# Patient Record
Sex: Female | Born: 1995 | Race: White | Hispanic: No | Marital: Single | Smoking: Never smoker
Health system: Southern US, Community
[De-identification: ages and names within clinical notes are randomized; demographics above are authoritative.]

## PROBLEM LIST (undated history)

## (undated) DIAGNOSIS — A749 Chlamydial infection, unspecified: Secondary | ICD-10-CM

## (undated) DIAGNOSIS — N200 Calculus of kidney: Secondary | ICD-10-CM

## (undated) DIAGNOSIS — Z789 Other specified health status: Secondary | ICD-10-CM

## (undated) HISTORY — DX: Other specified health status: Z78.9

## (undated) HISTORY — DX: Chlamydial infection, unspecified: A74.9

## (undated) HISTORY — PX: NO PAST SURGERIES: SHX2092

---
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
## 2014-01-20 LAB — OB RESULTS CONSOLE ABO/RH: RH Type: POSITIVE

## 2014-01-20 LAB — OB RESULTS CONSOLE HEPATITIS B SURFACE ANTIGEN: Hepatitis B Surface Ag: NEGATIVE

## 2014-01-20 LAB — OB RESULTS CONSOLE GC/CHLAMYDIA
CHLAMYDIA, DNA PROBE: NEGATIVE
GC PROBE AMP, GENITAL: NEGATIVE

## 2014-01-20 LAB — OB RESULTS CONSOLE RPR: RPR: NONREACTIVE

## 2014-01-20 LAB — OB RESULTS CONSOLE ANTIBODY SCREEN: ANTIBODY SCREEN: NEGATIVE

## 2014-01-20 LAB — OB RESULTS CONSOLE HIV ANTIBODY (ROUTINE TESTING): HIV: NONREACTIVE

## 2014-01-20 LAB — OB RESULTS CONSOLE RUBELLA ANTIBODY, IGM: RUBELLA: NON-IMMUNE/NOT IMMUNE

## 2014-03-24 NOTE — L&D Delivery Note (Signed)
Operative Delivery Note Pt pushed for about 2 hours and began to make less progress.  FHR had persistent  Variable decelerations of increasing intensity and the patient was becoming tired.  She and partner were counseled on the risks and benfits of a vacuum assisted delivery.   The softcup bell vacuum was placed on the vertex and inflated to the green zone.  Over 2 pulls and one pop off the vertex was brought to a +3 station.  The pt then pushed a few more times but could not bring the head to crowning.  The vacuum was changed to a kiwi because the bell was pushing against the suprapubic bone and deflating. The Kiwi was placed and the vertex delivered to crowning in one pull and no popofffs.   After delivery of the head a moderate shoulder dystocia was encountered and required Macroberts and posterior arm lift to effect delivery after 90 seconds.    At 10:03 PM a viable female was delivered via Vaginal, Spontaneous Delivery.  Presentation: vertex; Position: Left,, Occiput,, Anterior; Station: +2.  The baby was floppy at delivery and NICU called to assess.  She responded well to stimulation and O2.    Verbal consent: obtained from patient.  Risks and benefits discussed in detail.  Risks include, but are not limited to the risks of anesthesia, bleeding, infection, damage to maternal tissues, fetal cephalhematoma.  There is also the risk of inability to effect vaginal delivery of the head, or shoulder dystocia that cannot be resolved by established maneuvers, leading to the need for emergency cesarean section.  APGAR:  2 , 8; weight  7#1oz.   Placenta status: Intact, Spontaneous.   Cord: 3 vessels with the following complications: None.  Cord pH: 7.27  Anesthesia: Epidural  Instruments: vacuum Episiotomy: None Lacerations: 1st degree Suture Repair: 3.0 vicryl rapide Est. Blood Loss (mL): 305cc Pt had moderate atony that did not respond to pitocin initially, so given methergine x 1.  Mom to postpartum.   Baby to Couplet care / Skin to Skin.  Oliver PilaRICHARDSON,Fredrica Capano W 08/14/2014, 10:49 PM

## 2014-07-12 LAB — OB RESULTS CONSOLE GBS: STREP GROUP B AG: NEGATIVE

## 2014-08-09 ENCOUNTER — Telehealth (HOSPITAL_COMMUNITY): Payer: Self-pay | Admitting: *Deleted

## 2014-08-09 ENCOUNTER — Encounter (HOSPITAL_COMMUNITY): Payer: Self-pay | Admitting: *Deleted

## 2014-08-09 NOTE — Telephone Encounter (Signed)
Preadmission screen  

## 2014-08-12 NOTE — H&P (Signed)
Alexandra Spencer is a 19 y.o. female G1P0 at 5940 2/7 weeks (EDD 08/11/14 by 8 week US, inconsistent with LMP) presenting for IOL post due date.  Prenatal care relatively uncomplicated.  Anatomy US showed isolated EIF, but all else WNL and first trimester screen and AFP WNL.  Rubella status non-immune.  She measured S<D but an US performed 06/28/14 demonstrated the baby at the 48%ile and normal AFI.    Maternal Medical History:  Contractions: Frequency: irregular.   Perceived severity is mild.    Fetal activity: Perceived fetal activity is normal.    Prenatal Complications - Diabetes: none.    OB History    Gravida Para Term Preterm AB TAB SAB Ectopic Multiple Living   1              Past Medical History  Diagnosis Date  . Medical history non-contributory    Past Surgical History  Procedure Laterality Date  . No past surgeries     Family History: family history is not on file. Social History:  reports that she has never smoked. She does not have any smokeless tobacco history on file. She reports that she does not drink alcohol or use illicit drugs.   Prenatal Transfer Tool  Maternal Diabetes: No Genetic Screening: Normal Maternal Ultrasounds/Referrals: Normal Fetal Ultrasounds or other Referrals:  None Maternal Substance Abuse:  No Significant Maternal Medications:  None Significant Maternal Lab Results:  None Other Comments:  None  ROS    Last menstrual period 11/05/2013. Exam Physical Exam  Constitutional: She appears well-developed and well-nourished.  Cardiovascular: Normal rate and regular rhythm.   Respiratory: Effort normal.  GI: Soft.  Genitourinary: Vagina normal.  Neurological: She is alert.  Psychiatric: She has a normal mood and affect.    Prenatal labs: ABO, Rh: A/Positive/-- (10/30 0000) Antibody: Negative (10/30 0000) Rubella: Nonimmune (10/30 0000) RPR: Nonreactive (10/30 0000)  HBsAg: Negative (10/30 0000)  HIV: Non-reactive (10/30 0000)  GBS:  Negative (04/20 0000)  First trimester screen and AFP negative One hour GTT 109  Assessment/Plan: Pt for ripening and IOL given post due date.  GBS negative.  Plan cytotec and AROM/Pitocin  Alexandra Spencer,Alexandra Spencer 08/12/2014, 3:11 PM

## 2014-08-13 ENCOUNTER — Encounter (HOSPITAL_COMMUNITY): Payer: Self-pay

## 2014-08-13 ENCOUNTER — Inpatient Hospital Stay (HOSPITAL_COMMUNITY)
Admission: RE | Admit: 2014-08-13 | Discharge: 2014-08-16 | DRG: 775 | Disposition: A | Discharge status: Home / Self Care | Payer: Medicaid Other | Source: Ambulatory Visit | Attending: Obstetrics and Gynecology | Admitting: Obstetrics and Gynecology

## 2014-08-13 DIAGNOSIS — O480 Post-term pregnancy: Secondary | ICD-10-CM | POA: Diagnosis present

## 2014-08-13 DIAGNOSIS — Z3A40 40 weeks gestation of pregnancy: Secondary | ICD-10-CM | POA: Diagnosis present

## 2014-08-13 DIAGNOSIS — Z3403 Encounter for supervision of normal first pregnancy, third trimester: Secondary | ICD-10-CM | POA: Diagnosis present

## 2014-08-13 LAB — CBC
HEMATOCRIT: 34.4 % — AB (ref 36.0–46.0)
HEMOGLOBIN: 11.5 g/dL — AB (ref 12.0–15.0)
MCH: 27.4 pg (ref 26.0–34.0)
MCHC: 33.4 g/dL (ref 30.0–36.0)
MCV: 81.9 fL (ref 78.0–100.0)
PLATELETS: 294 10*3/uL (ref 150–400)
RBC: 4.2 MIL/uL (ref 3.87–5.11)
RDW: 13.1 % (ref 11.5–15.5)
WBC: 11.5 10*3/uL — AB (ref 4.0–10.5)

## 2014-08-13 LAB — TYPE AND SCREEN
ABO/RH(D): A POS
ANTIBODY SCREEN: NEGATIVE

## 2014-08-13 MED ORDER — OXYTOCIN 40 UNITS IN LACTATED RINGERS INFUSION - SIMPLE MED
1.0000 m[IU]/min | INTRAVENOUS | Status: DC
  Administered 2014-08-14 (×2): 2 m[IU]/min via INTRAVENOUS
  Filled 2014-08-13: qty 1000

## 2014-08-13 MED ORDER — OXYTOCIN BOLUS FROM INFUSION
500.0000 mL | INTRAVENOUS | Status: DC
Start: 2014-08-13 — End: 2014-08-15

## 2014-08-13 MED ORDER — LIDOCAINE HCL (PF) 1 % IJ SOLN
30.0000 mL | INTRAMUSCULAR | Status: DC | PRN
  Filled 2014-08-13: qty 30

## 2014-08-13 MED ORDER — OXYTOCIN 40 UNITS IN LACTATED RINGERS INFUSION - SIMPLE MED: 62.5000 mL/h | INTRAVENOUS | Status: DC

## 2014-08-13 MED ORDER — CITRIC ACID-SODIUM CITRATE 334-500 MG/5ML PO SOLN
30.0000 mL | ORAL | Status: DC | PRN
  Administered 2014-08-13: 30 mL via ORAL
  Filled 2014-08-13: qty 15

## 2014-08-13 MED ORDER — OXYCODONE-ACETAMINOPHEN 5-325 MG PO TABS: 1.0000 | ORAL_TABLET | ORAL | Status: DC | PRN

## 2014-08-13 MED ORDER — TERBUTALINE SULFATE 1 MG/ML IJ SOLN: 0.2500 mg | Freq: Once | INTRAMUSCULAR | Status: AC | PRN

## 2014-08-13 MED ORDER — BUTORPHANOL TARTRATE 1 MG/ML IJ SOLN: 1.0000 mg | INTRAMUSCULAR | Status: DC | PRN

## 2014-08-13 MED ORDER — MISOPROSTOL 25 MCG QUARTER TABLET
25.0000 ug | ORAL_TABLET | ORAL | Status: DC | PRN
  Administered 2014-08-13: 25 ug via VAGINAL
  Administered 2014-08-14: 1000 ug via VAGINAL
  Administered 2014-08-14 (×2): 25 ug via VAGINAL
  Filled 2014-08-13 (×3): qty 0.25
  Filled 2014-08-13: qty 1

## 2014-08-13 MED ORDER — LACTATED RINGERS IV SOLN
500.0000 mL | INTRAVENOUS | Status: DC | PRN
  Administered 2014-08-14: 500 mL via INTRAVENOUS

## 2014-08-13 MED ORDER — LACTATED RINGERS IV SOLN
INTRAVENOUS | Status: DC
  Administered 2014-08-13 – 2014-08-14 (×4): via INTRAVENOUS

## 2014-08-13 MED ORDER — ONDANSETRON HCL 4 MG/2ML IJ SOLN
4.0000 mg | Freq: Four times a day (QID) | INTRAMUSCULAR | Status: DC | PRN
  Administered 2014-08-14 (×2): 4 mg via INTRAVENOUS
  Filled 2014-08-13 (×2): qty 2

## 2014-08-13 MED ORDER — ACETAMINOPHEN 325 MG PO TABS: 650.0000 mg | ORAL_TABLET | ORAL | Status: DC | PRN

## 2014-08-13 MED ORDER — OXYCODONE-ACETAMINOPHEN 5-325 MG PO TABS: 2.0000 | ORAL_TABLET | ORAL | Status: DC | PRN

## 2014-08-13 NOTE — Plan of Care (Signed)
Problem: Consults Goal: Birthing Suites Patient Information Press F2 to bring up selections list Outcome: Completed/Met Date Met:  08/13/14  Pt > [redacted] weeks EGA and Inpatient induction

## 2014-08-14 ENCOUNTER — Encounter (HOSPITAL_COMMUNITY): Payer: Self-pay

## 2014-08-14 ENCOUNTER — Inpatient Hospital Stay (HOSPITAL_COMMUNITY): Payer: Medicaid Other | Admitting: Anesthesiology

## 2014-08-14 LAB — RPR: RPR: NONREACTIVE

## 2014-08-14 LAB — ABO/RH: ABO/RH(D): A POS

## 2014-08-14 MED ORDER — FENTANYL 2.5 MCG/ML BUPIVACAINE 1/10 % EPIDURAL INFUSION (WH - ANES)
14.0000 mL/h | INTRAMUSCULAR | Status: DC | PRN
  Administered 2014-08-14: 13 mL/h via EPIDURAL
  Administered 2014-08-14 (×2): 14 mL/h via EPIDURAL
  Filled 2014-08-14 (×2): qty 125

## 2014-08-14 MED ORDER — METHYLERGONOVINE MALEATE 0.2 MG/ML IJ SOLN
0.2000 mg | Freq: Once | INTRAMUSCULAR | Status: AC
  Administered 2014-08-14: 0.2 mg via INTRAMUSCULAR

## 2014-08-14 MED ORDER — LIDOCAINE HCL (PF) 1 % IJ SOLN
INTRAMUSCULAR | Status: DC | PRN
  Administered 2014-08-14: 3 mL
  Administered 2014-08-14: 4 mL

## 2014-08-14 MED ORDER — MISOPROSTOL 200 MCG PO TABS: 1000.0000 ug | ORAL_TABLET | Freq: Once | ORAL | Status: DC

## 2014-08-14 MED ORDER — PHENYLEPHRINE 40 MCG/ML (10ML) SYRINGE FOR IV PUSH (FOR BLOOD PRESSURE SUPPORT)
80.0000 ug | PREFILLED_SYRINGE | INTRAVENOUS | Status: DC | PRN
  Filled 2014-08-14: qty 2
  Filled 2014-08-14: qty 20

## 2014-08-14 MED ORDER — DIPHENHYDRAMINE HCL 50 MG/ML IJ SOLN: 12.5000 mg | INTRAMUSCULAR | Status: DC | PRN

## 2014-08-14 MED ORDER — FENTANYL 2.5 MCG/ML BUPIVACAINE 1/10 % EPIDURAL INFUSION (WH - ANES)
13.0000 mL/h | INTRAMUSCULAR | Status: DC | PRN
  Administered 2014-08-14: 13 mL/h via EPIDURAL
  Filled 2014-08-14: qty 125

## 2014-08-14 MED ORDER — MISOPROSTOL 200 MCG PO TABS
ORAL_TABLET | ORAL | Status: AC
  Filled 2014-08-14: qty 5

## 2014-08-14 MED ORDER — LACTATED RINGERS IV SOLN
INTRAVENOUS | Status: DC
  Administered 2014-08-14: 16:00:00 via INTRAUTERINE

## 2014-08-14 MED ORDER — EPHEDRINE 5 MG/ML INJ
10.0000 mg | INTRAVENOUS | Status: DC | PRN
  Filled 2014-08-14: qty 2

## 2014-08-14 MED ORDER — PHENYLEPHRINE 40 MCG/ML (10ML) SYRINGE FOR IV PUSH (FOR BLOOD PRESSURE SUPPORT)
80.0000 ug | PREFILLED_SYRINGE | INTRAVENOUS | Status: DC | PRN
  Filled 2014-08-14: qty 2

## 2014-08-14 NOTE — Progress Notes (Signed)
[facility]   DOB: 05-13-1995, 19 y.o.   MRN: [id number] Pt feeling minimal cramping afeb vss FHR reactive Cervix 2/70/-2 AROM clear Will start pitocin at 0930, since received last cytotec at 0530.

## 2014-08-14 NOTE — Progress Notes (Signed)
Patient ID: Alexandra Spencer, female   DOB: 1996/03/03, 19 y.o.   MRN: [id number] CTSP for variable decels  Pt comfortable with epidural Pt began to have significant variable decelerations with contractions about an hour ago and pitocin was d/c'd, an amnioinfusion was given     About 2 hours ago when had persistent mild variables  FHR recovered well in semi fowlers position.  RN was questioning hyperstimulation  Cervix c/8/-1 OP presentation  FHR currently back to category 1 with Alexandra variability and only occasional mild variables Suspect tracing more related to OP presentation than anything else Pt progressing well off pitocin so will leave off for now and follow progress and strip

## 2014-08-14 NOTE — Progress Notes (Signed)
[facility]   DOB: 1996/02/20, 19 y.o.   MRN: [id number] Pt comfortable with epidural FHR category 1 with a few mild variable decels Cervix 90/3-4/-1  IUPC placed, only on 2 mu--will see if room to go up

## 2014-08-14 NOTE — Anesthesia Preprocedure Evaluation (Signed)
Anesthesia Evaluation  Patient identified by MRN, date of birth, ID band Patient awake    Reviewed: Allergy & Precautions, Patient's Chart, lab work & pertinent test results  Airway Mallampati: III  TM Distance: >3 FB Neck ROM: Full    Dental no notable dental hx. (+) Teeth Intact   Pulmonary neg pulmonary ROS,  breath sounds clear to auscultation  Pulmonary exam normal       Cardiovascular negative cardio ROS Normal cardiovascular examRhythm:Regular Rate:Normal     Neuro/Psych negative neurological ROS  negative psych ROS   GI/Hepatic Neg liver ROS, GERD-  ,  Endo/Other  Obesity  Renal/GU negative Renal ROS  negative genitourinary   Musculoskeletal   Abdominal (+) + obese,   Peds  Hematology  (+) anemia ,   Anesthesia Other Findings   Reproductive/Obstetrics                             Anesthesia Physical Anesthesia Plan  ASA: II  Anesthesia Plan: General   Post-op Pain Management:    Induction: Intravenous  Airway Management Planned: Natural Airway  Additional Equipment:   Intra-op Plan:   Post-operative Plan:   Informed Consent: I have reviewed the patients History and Physical, chart, labs and discussed the procedure including the risks, benefits and alternatives for the proposed anesthesia with the patient or authorized representative who has indicated his/her understanding and acceptance.     Plan Discussed with: Anesthesiologist  Anesthesia Plan Comments:         Anesthesia Quick Evaluation

## 2014-08-14 NOTE — Anesthesia Procedure Notes (Signed)
Epidural Patient location during procedure: OB Start time: 08/14/2014 11:03 AM  Staffing Anesthesiologist: Mal AmabileFOSTER, Jeriko Kowalke Performed by: anesthesiologist   Preanesthetic Checklist Completed: patient identified, site marked, surgical consent, pre-op evaluation, timeout performed, IV checked, risks and benefits discussed and monitors and equipment checked  Epidural Patient position: sitting Prep: site prepped and draped and DuraPrep Patient monitoring: continuous pulse ox and blood pressure Approach: midline Location: L4-L5 Injection technique: LOR air  Needle:  Needle type: Tuohy  Needle gauge: 17 G Needle length: 9 cm and 9 Needle insertion depth: 5 cm cm Catheter type: closed end flexible Catheter size: 19 Gauge Catheter at skin depth: 10 cm Test dose: negative and Other  Assessment Events: blood not aspirated, injection not painful, no injection resistance, negative IV test and no paresthesia  Additional Notes Patient identified. Risks and benefits discussed including failed block, incomplete  Pain control, post dural puncture headache, nerve damage, paralysis, blood pressure Changes, nausea, vomiting, reactions to medications-both toxic and allergic and post Partum back pain. All questions were answered. Patient expressed understanding and wished to proceed. Sterile technique was used throughout procedure. Epidural site was Dressed with sterile barrier dressing. No paresthesias, signs of intravascular injection Or signs of intrathecal spread were encountered.  Patient was more comfortable after the epidural was dosed. Please see RN's note for documentation of vital signs and FHR which are stable.

## 2014-08-15 LAB — CBC
HEMATOCRIT: 28.2 % — AB (ref 36.0–46.0)
HEMOGLOBIN: 9.3 g/dL — AB (ref 12.0–15.0)
MCH: 27 pg (ref 26.0–34.0)
MCHC: 33 g/dL (ref 30.0–36.0)
MCV: 81.7 fL (ref 78.0–100.0)
Platelets: 275 10*3/uL (ref 150–400)
RBC: 3.45 MIL/uL — AB (ref 3.87–5.11)
RDW: 13.2 % (ref 11.5–15.5)
WBC: 19.4 10*3/uL — AB (ref 4.0–10.5)

## 2014-08-15 MED ORDER — OXYCODONE-ACETAMINOPHEN 5-325 MG PO TABS
1.0000 | ORAL_TABLET | ORAL | Status: DC | PRN
  Administered 2014-08-15 (×2): 1 via ORAL
  Filled 2014-08-15 (×2): qty 1

## 2014-08-15 MED ORDER — IBUPROFEN 100 MG/5ML PO SUSP
600.0000 mg | Freq: Four times a day (QID) | ORAL | Status: DC
  Administered 2014-08-15 – 2014-08-16 (×4): 600 mg via ORAL
  Filled 2014-08-15 (×8): qty 30

## 2014-08-15 MED ORDER — DIBUCAINE 1 % RE OINT: 1.0000 "application " | TOPICAL_OINTMENT | RECTAL | Status: DC | PRN

## 2014-08-15 MED ORDER — TETANUS-DIPHTH-ACELL PERTUSSIS 5-2.5-18.5 LF-MCG/0.5 IM SUSP: 0.5000 mL | Freq: Once | INTRAMUSCULAR | Status: DC

## 2014-08-15 MED ORDER — SIMETHICONE 80 MG PO CHEW: 80.0000 mg | CHEWABLE_TABLET | ORAL | Status: DC | PRN

## 2014-08-15 MED ORDER — ZOLPIDEM TARTRATE 5 MG PO TABS: 5.0000 mg | ORAL_TABLET | Freq: Every evening | ORAL | Status: DC | PRN

## 2014-08-15 MED ORDER — ACETAMINOPHEN 325 MG PO TABS: 650.0000 mg | ORAL_TABLET | ORAL | Status: DC | PRN

## 2014-08-15 MED ORDER — COMPLETENATE 29-1 MG PO CHEW
1.0000 | CHEWABLE_TABLET | Freq: Every day | ORAL | Status: DC
  Administered 2014-08-15: 1 via ORAL
  Filled 2014-08-15 (×3): qty 1

## 2014-08-15 MED ORDER — WITCH HAZEL-GLYCERIN EX PADS: 1.0000 "application " | MEDICATED_PAD | CUTANEOUS | Status: DC | PRN

## 2014-08-15 MED ORDER — PRENATAL MULTIVITAMIN CH
1.0000 | ORAL_TABLET | Freq: Every day | ORAL | Status: DC
  Filled 2014-08-15: qty 1

## 2014-08-15 MED ORDER — ONDANSETRON HCL 4 MG PO TABS: 4.0000 mg | ORAL_TABLET | ORAL | Status: DC | PRN

## 2014-08-15 MED ORDER — DIPHENHYDRAMINE HCL 25 MG PO CAPS: 25.0000 mg | ORAL_CAPSULE | Freq: Four times a day (QID) | ORAL | Status: DC | PRN

## 2014-08-15 MED ORDER — BENZOCAINE-MENTHOL 20-0.5 % EX AERO
1.0000 "application " | INHALATION_SPRAY | CUTANEOUS | Status: DC | PRN
  Administered 2014-08-15: 1 via TOPICAL
  Filled 2014-08-15: qty 56

## 2014-08-15 MED ORDER — SENNOSIDES-DOCUSATE SODIUM 8.6-50 MG PO TABS: 2.0000 | ORAL_TABLET | ORAL | Status: DC

## 2014-08-15 MED ORDER — LANOLIN HYDROUS EX OINT: TOPICAL_OINTMENT | CUTANEOUS | Status: DC | PRN

## 2014-08-15 MED ORDER — ONDANSETRON HCL 4 MG/2ML IJ SOLN: 4.0000 mg | INTRAMUSCULAR | Status: DC | PRN

## 2014-08-15 MED ORDER — IBUPROFEN 600 MG PO TABS
600.0000 mg | ORAL_TABLET | Freq: Four times a day (QID) | ORAL | Status: DC
  Administered 2014-08-15 (×2): 600 mg via ORAL
  Filled 2014-08-15 (×2): qty 1

## 2014-08-15 MED ORDER — OXYCODONE-ACETAMINOPHEN 5-325 MG PO TABS: 2.0000 | ORAL_TABLET | ORAL | Status: DC | PRN

## 2014-08-15 NOTE — Anesthesia Postprocedure Evaluation (Signed)
Anesthesia Post Note  [facility] [place]  Procedure(s) Performed: * No procedures listed *  Anesthesia type: Epidural  Patient location: Mother/Baby  Post pain: Pain level controlled  Post assessment: Post-op Vital signs reviewed  Last Vitals:  Filed Vitals:   08/15/14 0900  BP: 114/57  Pulse: 77  Temp: 36.8 C  Resp: 16    Post vital signs: Reviewed  Level of consciousness:alert  Complications: No apparent anesthesia complications

## 2014-08-15 NOTE — Lactation Note (Signed)
This note was copied from the chart of [place]. Lactation Consultation Note: infant is 6814 hours old and has had one good feeding and several attempts. Infant has had 2 voids and one stool. [facility] . Basic teaching done and reviewed baby and me book. She states she is active with WIC. Mother has been attempting to feed for 5-10 mins. Assist mother with proper positioning using good support. Infant screams at breast with wide open mouth, no sustained latch after trying several positions. Mother was getting frustrated. Infant placed STS . Mother taught hand expression. Infant was spoon fed 7 ml of colostrum. Mother advised to page for next feeding assist. She was also given a hand pump with instructions if needed. Advised mother to continue to attempt to feed infant 8-12 times and with feeding cues.   Patient Name: Alexandra Spencer Today's Date: 08/15/2014 Reason for consult: Initial assessment   Maternal Data Has patient been taught Hand Expression?: Yes Does the patient have breastfeeding experience prior to this delivery?: No  Feeding Feeding Type: Breast Milk Length of feed: 10 min (on and off , baby screaming)  LATCH Score/Interventions Latch: Repeated attempts needed to sustain latch, nipple held in mouth throughout feeding, stimulation needed to elicit sucking reflex.  Audible Swallowing: A few with stimulation (on and off)  Type of Nipple: Everted at rest and after stimulation  Comfort (Breast/Nipple): Soft / non-tender     Hold (Positioning): Assistance needed to correctly position infant at breast and maintain latch. Intervention(s): Breastfeeding basics reviewed;Support Pillows;Position options;Skin to skin  LATCH Score: 7  Lactation Tools Discussed/Used     Consult Status Consult Status: Follow-up Date: 08/15/14 Follow-up type: In-patient    Stevan BornKendrick, Lakresha Stifter [facility] 08/15/2014, 2:36 PM

## 2014-08-15 NOTE — Progress Notes (Signed)
Post Partum Day 1 Subjective: tolerating PO with no nausea. C/o some persistent right leg numbness this AM  Objective: Blood pressure 114/57, pulse 77, temperature 98.3 F (36.8 C), temperature source Oral, resp. rate 16, height 5\' 3"  (1.6 m), weight 85.276 kg (188 lb), last menstrual period 11/05/2013, SpO2 99 %, unknown if currently breastfeeding.  Physical Exam:  General: alert and cooperative Lochia: appropriate Uterine Fundus: firm    Recent Labs  08/13/14 2115 08/15/14 0600  HGB 11.5* 9.3*  HCT 34.4* 28.2*    Assessment/Plan: Plan for discharge tomorrow   LOS: 2 days   Nisaiah Bechtol W 08/15/2014, 5:44 PM

## 2014-08-16 MED ORDER — OXYCODONE-ACETAMINOPHEN 5-325 MG PO TABS: 1.0000 | ORAL_TABLET | ORAL | Status: DC | PRN

## 2014-08-16 MED ORDER — IBUPROFEN 100 MG/5ML PO SUSP: 600.0000 mg | Freq: Four times a day (QID) | ORAL | Status: DC

## 2014-08-16 MED ORDER — MEASLES, MUMPS & RUBELLA VAC ~~LOC~~ INJ
0.5000 mL | INJECTION | Freq: Once | SUBCUTANEOUS | Status: AC
  Administered 2014-08-16: 0.5 mL via SUBCUTANEOUS
  Filled 2014-08-16: qty 0.5

## 2014-08-16 NOTE — Progress Notes (Signed)
Attended 'Well After Birth' group class which presented discharge care information for both Mom and Baby. 

## 2014-08-16 NOTE — Progress Notes (Signed)
Post Partum Day 2 Subjective: no complaints, up ad lib and tolerating PO  Objective: Blood pressure 114/52, pulse 86, temperature 98 F (36.7 C), temperature source Oral, resp. rate 18, height 5\' 3"  (1.6 m), weight 85.276 kg (188 lb), last menstrual period 11/05/2013, SpO2 99 %, unknown if currently breastfeeding.  Physical Exam:  General: alert and cooperative Lochia: appropriate Uterine Fundus: firm    Recent Labs  08/13/14 2115 08/15/14 0600  HGB 11.5* 9.3*  HCT 34.4* 28.2*    Assessment/Plan: Discharge home   LOS: 3 days   Alexandra Spencer W 08/16/2014, 7:33 AM

## 2014-08-16 NOTE — Discharge Summary (Signed)
Obstetric Discharge Summary Reason for Admission: induction of labor Prenatal Procedures: none Intrapartum Procedures: vacuum Postpartum Procedures: none Complications-Operative and Postpartum: first degree perineal laceration HEMOGLOBIN  Date Value Ref Range Status  08/15/2014 9.3* 12.0 - 15.0 g/dL Final   HCT  Date Value Ref Range Status  08/15/2014 28.2* 36.0 - 46.0 % Final    Physical Exam:  General: alert and cooperative Lochia: appropriate Uterine Fundus: firm  Discharge Diagnoses: Term Pregnancy-delivered                                          Vacuum assisited delivery                                         Moderate shoulder dystocia  Discharge Information: Date: 08/16/2014 Activity: pelvic rest Diet: routine Medications: Ibuprofen Condition: improved Instructions: refer to practice specific booklet Discharge to: home Follow-up Information    Follow up with Oliver PilaICHARDSON,Deloy Archey W, MD In 6 weeks.   Specialty:  Obstetrics and Gynecology   Why:  postpartum   Contact information:   [address] [place] [address] [phone]       Newborn Data: Live born female  Birth Weight: 7 lb 1.8 oz (3226 g) APGAR: 2, 8  Home with mother.  Oliver PilaICHARDSON,Bohden Dung W 08/16/2014, 7:30 AM

## 2015-04-18 ENCOUNTER — Emergency Department (HOSPITAL_COMMUNITY): Payer: Medicaid Other

## 2015-04-18 ENCOUNTER — Encounter (HOSPITAL_COMMUNITY): Payer: Self-pay | Admitting: Adult Health

## 2015-04-18 ENCOUNTER — Emergency Department (HOSPITAL_COMMUNITY)
Admission: EM | Admit: 2015-04-18 | Discharge: 2015-04-18 | Disposition: A | Discharge status: Home / Self Care | Payer: Medicaid Other | Attending: Emergency Medicine | Admitting: Emergency Medicine

## 2015-04-18 DIAGNOSIS — Z3202 Encounter for pregnancy test, result negative: Secondary | ICD-10-CM | POA: Insufficient documentation

## 2015-04-18 DIAGNOSIS — Z79899 Other long term (current) drug therapy: Secondary | ICD-10-CM | POA: Diagnosis not present

## 2015-04-18 DIAGNOSIS — N201 Calculus of ureter: Secondary | ICD-10-CM | POA: Diagnosis not present

## 2015-04-18 DIAGNOSIS — R1031 Right lower quadrant pain: Secondary | ICD-10-CM | POA: Diagnosis present

## 2015-04-18 LAB — COMPREHENSIVE METABOLIC PANEL
ALK PHOS: 65 U/L (ref 38–126)
ALT: 11 U/L — ABNORMAL LOW (ref 14–54)
ANION GAP: 12 (ref 5–15)
AST: 15 U/L (ref 15–41)
Albumin: 3.9 g/dL (ref 3.5–5.0)
BILIRUBIN TOTAL: 0.1 mg/dL — AB (ref 0.3–1.2)
BUN: 11 mg/dL (ref 6–20)
CALCIUM: 9.1 mg/dL (ref 8.9–10.3)
CO2: 22 mmol/L (ref 22–32)
Chloride: 106 mmol/L (ref 101–111)
Creatinine, Ser: 0.9 mg/dL (ref 0.44–1.00)
GFR calc non Af Amer: >60 mL/min (ref >60)
Glucose, Bld: 108 mg/dL — ABNORMAL HIGH (ref 65–99)
Potassium: 3.8 mmol/L (ref 3.5–5.1)
Sodium: 140 mmol/L (ref 135–145)
TOTAL PROTEIN: 6.7 g/dL (ref 6.5–8.1)

## 2015-04-18 LAB — URINALYSIS, ROUTINE W REFLEX MICROSCOPIC
BILIRUBIN URINE: NEGATIVE
Glucose, UA: NEGATIVE mg/dL
Ketones, ur: NEGATIVE mg/dL
NITRITE: NEGATIVE
Protein, ur: 100 mg/dL — AB
SPECIFIC GRAVITY, URINE: 1.03 (ref 1.005–1.030)
pH: 6.5 (ref 5.0–8.0)

## 2015-04-18 LAB — URINE MICROSCOPIC-ADD ON

## 2015-04-18 LAB — CBC
HEMATOCRIT: 39.7 % (ref 36.0–46.0)
Hemoglobin: 12.4 g/dL (ref 12.0–15.0)
MCH: 24.4 pg — ABNORMAL LOW (ref 26.0–34.0)
MCHC: 31.2 g/dL (ref 30.0–36.0)
MCV: 78 fL (ref 78.0–100.0)
Platelets: 420 10*3/uL — ABNORMAL HIGH (ref 150–400)
RBC: 5.09 MIL/uL (ref 3.87–5.11)
RDW: 15.1 % (ref 11.5–15.5)
WBC: 12.4 10*3/uL — AB (ref 4.0–10.5)

## 2015-04-18 LAB — POC URINE PREG, ED: PREG TEST UR: NEGATIVE

## 2015-04-18 LAB — LIPASE, BLOOD: Lipase: 27 U/L (ref 11–51)

## 2015-04-18 MED ORDER — HYDROMORPHONE HCL 1 MG/ML IJ SOLN
1.0000 mg | Freq: Once | INTRAMUSCULAR | Status: AC
  Administered 2015-04-18: 1 mg via INTRAVENOUS
  Filled 2015-04-18: qty 1

## 2015-04-18 MED ORDER — FENTANYL CITRATE (PF) 100 MCG/2ML IJ SOLN
50.0000 ug | Freq: Once | INTRAMUSCULAR | Status: AC
  Administered 2015-04-18: 50 ug via INTRAVENOUS
  Filled 2015-04-18: qty 2

## 2015-04-18 MED ORDER — TAMSULOSIN HCL 0.4 MG PO CAPS: ORAL_CAPSULE | ORAL | Status: DC

## 2015-04-18 MED ORDER — ONDANSETRON HCL 4 MG PO TABS: 4.0000 mg | ORAL_TABLET | Freq: Three times a day (TID) | ORAL | Status: DC | PRN

## 2015-04-18 MED ORDER — OXYCODONE-ACETAMINOPHEN 5-325 MG PO TABS: 1.0000 | ORAL_TABLET | ORAL | Status: DC | PRN

## 2015-04-18 MED ORDER — ONDANSETRON HCL 4 MG/2ML IJ SOLN
4.0000 mg | Freq: Once | INTRAMUSCULAR | Status: AC
  Administered 2015-04-18: 4 mg via INTRAVENOUS
  Filled 2015-04-18: qty 2

## 2015-04-18 MED ORDER — KETOROLAC TROMETHAMINE 30 MG/ML IJ SOLN
30.0000 mg | Freq: Once | INTRAMUSCULAR | Status: AC
  Administered 2015-04-18: 30 mg via INTRAVENOUS
  Filled 2015-04-18: qty 1

## 2015-04-18 NOTE — Discharge Instructions (Signed)
Drink plenty of fluids. Try to stop drinking caffeinated drinks. These will increase your risk of getting a kidney stone again. Take the medication as prescribed. If you get worse such as getting a fever, or have uncontrollable vomiting or pain he should go to [facility] emergency department. Otherwise follow-up with the urologist in that the office.   Dietary Guidelines to Help Prevent Kidney Stones Your risk of kidney stones can be decreased by adjusting the foods you eat. The most important thing you can do is drink enough fluid. You should drink enough fluid to keep your urine clear or pale yellow. The following guidelines provide specific information for the type of kidney stone you have had. GUIDELINES ACCORDING TO TYPE OF KIDNEY STONE Calcium Oxalate Kidney Stones  Reduce the amount of salt you eat. Foods that have a lot of salt cause your body to release excess calcium into your urine. The excess calcium can combine with a substance called oxalate to form kidney stones.  Reduce the amount of animal protein you eat if the amount you eat is excessive. Animal protein causes your body to release excess calcium into your urine. Ask your dietitian how much protein from animal sources you should be eating.  Avoid foods that are high in oxalates. If you take vitamins, they should have less than 500 mg of vitamin C. Your body turns vitamin C into oxalates. You do not need to avoid fruits and vegetables high in vitamin C. Calcium Phosphate Kidney Stones  Reduce the amount of salt you eat to help prevent the release of excess calcium into your urine.  Reduce the amount of animal protein you eat if the amount you eat is excessive. Animal protein causes your body to release excess calcium into your urine. Ask your dietitian how much protein from animal sources you should be eating.  Get enough calcium from food or take a calcium supplement (ask your dietitian for recommendations). Food sources of  calcium that do not increase your risk of kidney stones include:  Broccoli.  Dairy products, such as cheese and yogurt.  Pudding. Uric Acid Kidney Stones  Do not have more than 6 oz of animal protein per day. FOOD SOURCES Animal Protein Sources  Meat (all types).  Poultry.  Eggs.  Fish, seafood. Foods High in [employer] seasonings.  Soy sauce.  Teriyaki sauce.  Cured and processed meats.  Salted crackers and snack foods.  Fast food.  Canned soups and most canned foods. Foods High in Oxalates  Grains:  Amaranth.  Barley.  Grits.  Wheat germ.  Bran.  Buckwheat flour.  All bran cereals.  Pretzels.  Whole wheat bread.  Vegetables:  Beans (wax).  Beets and beet greens.  Collard greens.  Eggplant.  Escarole.  Leeks.  Okra.  Parsley.  Rutabagas.  Spinach.  Swiss chard.  Tomato paste.  Fried potatoes.  Sweet potatoes.  Fruits:  Red currants.  Figs.  Kiwi.  Rhubarb.  Meat and Other Protein Sources:  Beans (dried).  Soy burgers and other soybean products.  Miso.  Nuts (peanuts, almonds, pecans, cashews, hazelnuts).  Nut butters.  Sesame seeds and tahini (paste made of sesame seeds).  Poppy seeds.  Beverages:  Chocolate drink mixes.  Soy milk.  Instant iced tea.  Juices made from high-oxalate fruits or vegetables.  Other:  Carob.  Chocolate.  Fruitcake.  Marmalades.   This information is not intended to replace advice given to you by your health care provider. Make sure you  discuss any questions you have with your health care provider.   Document Released: 07/05/2010 Document Revised: 03/15/2013 Document Reviewed: 02/04/2013 Elsevier Interactive Patient Education 2016 Else[employer] Inc[employer]  Kidney Stones Kidney stones (urolithiasis) are deposits that form inside your kidneys. The intense pain is caused by the stone moving through the urinary tract. When the stone moves, the ureter goes into spasm  around the stone. The stone is usually passed in the urine.  CAUSES   A disorder that makes certain neck glands produce too much parathyroid hormone (primary hyperparathyroidism).  A buildup of uric acid crystals, similar to gout in your joints.  Narrowing (stricture) of the ureter.  A kidney obstruction present at birth (congenital obstruction).  Previous surgery on the kidney or ureters.  Numerous kidney infections. SYMPTOMS   Feeling sick to your stomach (nauseous).  Throwing up (vomiting).  Blood in the urine (hematuria).  Pain that usually spreads (radiates) to the groin.  Frequency or urgency of urination. DIAGNOSIS   Taking a history and physical exam.  Blood or urine tests.  CT scan.  Occasionally, an examination of the inside of the urinary bladder (cystoscopy) is performed. TREATMENT   Observation.  Increasing your fluid intake.  Extracorporeal shock wave lithotripsy--This is a noninvasive procedure that uses shock waves to break up kidney stones.  Surgery may be needed if you have severe pain or persistent obstruction. There are various surgical procedures. Most of the procedures are performed with the use of small instruments. Only small incisions are needed to accommodate these instruments, so recovery time is minimized. The size, location, and chemical composition are all important variables that will determine the proper choice of action for you. Talk to your health care provider to better understand your situation so that you will minimize the risk of injury to yourself and your kidney.  HOME CARE INSTRUCTIONS   Drink enough water and fluids to keep your urine clear or pale yellow. This will help you to pass the stone or stone fragments.  Strain all urine through the provided strainer. Keep all particulate matter and stones for your health care provider to see. The stone causing the pain may be as small as a grain of salt. It is very important to use  the strainer each and every time you pass your urine. The collection of your stone will allow your health care provider to analyze it and verify that a stone has actually passed. The stone analysis will often identify what you can do to reduce the incidence of recurrences.  Only take over-the-counter or prescription medicines for pain, discomfort, or fever as directed by your health care provider.  Keep all follow-up visits as told by your health care provider. This is important.  Get follow-up X-rays if required. The absence of pain does not always mean that the stone has passed. It may have only stopped moving. If the urine remains completely obstructed, it can cause loss of kidney function or even complete destruction of the kidney. It is your responsibility to make sure X-rays and follow-ups are completed. Ultrasounds of the kidney can show blockages and the status of the kidney. Ultrasounds are not associated with any radiation and can be performed easily in a matter of minutes.  Make changes to your daily diet as told by your health care provider. You may be told to:  Limit the amount of salt that you eat.  Eat 5 or more servings of fruits and vegetables each day.  Limit the amount of  meat, poultry, fish, and eggs that you eat.  Collect a 24-hour urine sample as told by your health care provider.You may need to collect another urine sample every 6-12 months. SEEK MEDICAL CARE IF:  You experience pain that is progressive and unresponsive to any pain medicine you have been prescribed. SEEK IMMEDIATE MEDICAL CARE IF:   Pain cannot be controlled with the prescribed medicine.  You have a fever or shaking chills.  The severity or intensity of pain increases over 18 hours and is not relieved by pain medicine.  You develop a new onset of abdominal pain.  You feel faint or pass out.  You are unable to urinate.   This information is not intended to replace advice given to you by your  health care provider. Make sure you discuss any questions you have with your health care provider.   Document Released: 03/10/2005 Document Revised: 11/29/2014 Document Reviewed: 08/11/2012 Elsevier Interactive Patient Education [employer]

## 2015-04-18 NOTE — ED Notes (Signed)
Consulted attending MD for pain management.

## 2015-04-18 NOTE — ED Notes (Signed)
Presents with right sided at umbilicus abdominal pain, began 2 days ago as dull ache and all of a sudden this eveing pain became severe and is described as contraction like-pt has trouble sitting-denies nausea, vomitng and diarrhea. Pain radiates into right back and side.

## 2015-04-18 NOTE — ED Provider Notes (Signed)
CSN: [id number]     Arrival date & time 04/18/15  0349 History   First MD Initiated Contact with Patient 04/18/15 [phone]    Chief Complaint  Patient presents with  . Abdominal Pain    (Consider location/radiation/quality/duration/timing/severity/associated sxs/prior Treatment) HPI patient states she had a IUD inserted in July after the birth of her child. She states she's been having some intermittent cramping discomfort since she had the IUD inserted. She states she still feels the string however. She states she has not had a period but intermittently has some spotting. She states about midnight she had acute onset of right lower quadrant pain. She took a Percocet left over from her surgery and had pain relief for about 2 hours. However the pain did return. She has had nausea and vomited once. She states now the pain is radiating into her right flank. She denies hematuria or dysuria. She denies diarrhea. She states nothing she does makes the pain feel better, nothing she does makes the pain feel worse. She does admit to a lot of caffeine ingestion. She also has a sister who has a history of renal stones.  OB-GYN Dr Senaida Ores  Past Medical History  Diagnosis Date  . Medical history non-contributory    Past Surgical History  Procedure Laterality Date  . No past surgeries     History reviewed. No pertinent family history. Social History  Substance Use Topics  . Smoking status: Never Smoker   . Smokeless tobacco: None  . Alcohol Use: No   employed  OB History    Gravida Para Term Preterm AB TAB SAB Ectopic Multiple Living   0 1     Review of Systems  All other systems reviewed and are negative.     Allergies  Review of patient's allergies indicates no known allergies.  Home Medications   Prior to Admission medications   Medication Sig Start Date End Date Taking? Authorizing Provider  levonorgestrel (MIRENA) 20 MCG/24HR IUD 1 each by Intrauterine route once.    Yes Historical Provider, MD  ondansetron (ZOFRAN) 4 MG tablet Take 1 tablet (4 mg total) by mouth every 8 (eight) hours as needed for nausea or vomiting. 04/18/15   Devoria Albe, MD  oxyCODONE-acetaminophen (PERCOCET/ROXICET) 5-325 MG tablet Take 1 tablet by mouth every 4 (four) hours as needed for moderate pain or severe pain. 04/18/15   Devoria Albe, MD  tamsulosin (FLOMAX) 0.4 MG CAPS capsule Take 1 po QD until you pass the stone. 04/18/15   Devoria Albe, MD   BP 128/115 mmHg  Pulse 115  Temp(Src) 97.8 F (36.6 C) (Oral)  Resp 20  SpO2 100%  LMP  (LMP Unknown)  Vital signs normal except for tachycardia  Physical Exam  Constitutional: She is oriented to person, place, and time. She appears well-developed and well-nourished.  Non-toxic appearance. She does not appear ill. She appears distressed.  Patient unable to sit still, she is pacing around the room holding her right lower abdomen.  HENT:  Head: Normocephalic and atraumatic.  Right Ear: External ear normal.  Left Ear: External ear normal.  Nose: Nose normal. No mucosal edema or rhinorrhea.  Mouth/Throat: Oropharynx is clear and moist and mucous membranes are normal. No dental abscesses or uvula swelling.  Eyes: Conjunctivae and EOM are normal. Pupils are equal, round, and reactive to light.  Neck: Normal range of motion and full passive range of motion without pain. Neck supple.  Cardiovascular: Normal rate,  regular rhythm and normal heart sounds.  Exam reveals no gallop and no friction rub.   No murmur heard. Pulmonary/Chest: Effort normal and breath sounds normal. No respiratory distress. She has no wheezes. She has no rhonchi. She has no rales. She exhibits no tenderness and no crepitus.  Abdominal: Soft. Normal appearance and bowel sounds are normal. She exhibits no distension. There is no tenderness. There is no rebound and no guarding.  Although patient has pain in her abdomen she does not have pain to palpation.  Musculoskeletal:  Normal range of motion. She exhibits no edema or tenderness.  Moves all extremities well.   Neurological: She is alert and oriented to person, place, and time. She has normal strength. No cranial nerve deficit.  Skin: Skin is warm, dry and intact. No rash noted. No erythema. There is pallor.  Psychiatric: She has a normal mood and affect. Her speech is normal and behavior is normal. Her mood appears not anxious.  Nursing note and vitals reviewed.   ED Course  Procedures (including critical care time)  Medications  fentaNYL (SUBLIMAZE) injection 50 mcg (50 mcg Intravenous Given 04/18/15 0413)  HYDROmorphone (DILAUDID) injection 1 mg (1 mg Intravenous Given 04/18/15 0536)  ondansetron (ZOFRAN) injection 4 mg (4 mg Intravenous Given 04/18/15 0536)  ketorolac (TORADOL) 30 MG/ML injection 30 mg (30 mg Intravenous Given 04/18/15 0718)    Patient was given IV pain and nausea medication for presumed kidney stone. CT scan of the abdomen was ordered.  Patient was rechecked at 6:15 AM. She is lying quietly in bed and states her pain is much improved.  6:55 AM I have reviewed patient's scan with patient and her mother. She states right now her pain is gone. She was given IV Toradol. We discussed stopping her caffeine intake and drinking lots of bottled water. She is to follow-up with urology. [facility] she gets fever, unrelenting vomiting or pain. Patient was reassured that her IUD was in proper position.  Recheck at 7:45 AM patient remains pain-free and feels ready to go home.   Labs Review Results for orders placed or performed during the hospital encounter of 04/18/15  Lipase, blood  Result Value Ref Range   Lipase 27 11 - 51 U/L  Comprehensive metabolic panel  Result Value Ref Range   Sodium 140 135 - 145 mmol/L   Potassium 3.8 3.5 - 5.1 mmol/L   Chloride 106 101 - 111 mmol/L   CO2 22 22 - 32 mmol/L   Glucose, Bld 108 (H) 65 - 99 mg/dL   BUN 11  6 - 20 mg/dL   Creatinine, Ser 1.61 0.44 - 1.00 mg/dL   Calcium 9.1 8.9 - 09.6 mg/dL   Total Protein 6.7 6.5 - 8.1 g/dL   Albumin 3.9 3.5 - 5.0 g/dL   AST 15 15 - 41 U/L   ALT 11 (L) 14 - 54 U/L   Alkaline Phosphatase 65 38 - 126 U/L   Total Bilirubin 0.1 (L) 0.3 - 1.2 mg/dL   GFR calc non Af Amer >60 >60 mL/min   GFR calc Af Amer >60 >60 mL/min   Anion gap 12 5 - 15  CBC  Result Value Ref Range   WBC 12.4 (H) 4.0 - 10.5 K/uL   RBC 5.09 3.87 - 5.11 MIL/uL   Hemoglobin 12.4 12.0 - 15.0 g/dL   HCT 04.5 40.9 - 81.1 %   MCV 78.0 78.0 - 100.0 fL   MCH  24.4 (L) 26.0 - 34.0 pg   MCHC 31.2 30.0 - 36.0 g/dL   RDW 16.1 09.6 - 04.5 %   Platelets 420 (H) 150 - 400 K/uL  Urinalysis, Routine w reflex microscopic (not at [facility])  Result Value Ref Range   Color, Urine YELLOW YELLOW   APPearance TURBID (A) CLEAR   Specific Gravity, Urine 1.030 1.005 - 1.030   pH 6.5 5.0 - 8.0   Glucose, UA NEGATIVE NEGATIVE mg/dL   Hgb urine dipstick LARGE (A) NEGATIVE   Bilirubin Urine NEGATIVE NEGATIVE   Ketones, ur NEGATIVE NEGATIVE mg/dL   Protein, ur 409 (A) NEGATIVE mg/dL   Nitrite NEGATIVE NEGATIVE   Leukocytes, UA SMALL (A) NEGATIVE  Urine microscopic-add on  Result Value Ref Range   Squamous Epithelial / LPF 6-30 (A) NONE SEEN   WBC, UA 6-30 0 - 5 WBC/hpf   RBC / HPF TOO NUMEROUS TO COUNT 0 - 5 RBC/hpf   Bacteria, UA MANY (A) NONE SEEN   Urine-Other MUCOUS PRESENT   POC urine preg, ED (not at [facility])  Result Value Ref Range   Preg Test, Ur NEGATIVE NEGATIVE   Laboratory interpretation all normal except microscopic hematuria, leukocytosis     Imaging Review Ct Renal Stone Study  04/18/2015  CLINICAL DATA:  Acute onset of right periumbilical abdominal pain, radiating to the right back. Initial encounter. EXAM: CT ABDOMEN AND PELVIS WITHOUT CONTRAST TECHNIQUE: Multidetector CT imaging of the abdomen and pelvis was performed following the standard protocol without IV contrast. COMPARISON:  None.  FINDINGS: The visualized lung bases are clear. The liver and spleen are unremarkable in appearance. The gallbladder is within normal limits. The pancreas and adrenal glands are unremarkable. Mild-to-moderate right-sided hydronephrosis is noted, with prominence of the right ureter along most of its course, and an obstructing 5 x 4 mm stone at the distal right ureter, 2 cm above the right vesicoureteral junction. The left kidney is grossly unremarkable in appearance. Areas of minimally increased attenuation within the renal calyces are nonspecific. The small bowel is unremarkable in appearance. The stomach is within normal limits. No acute vascular abnormalities are seen. The appendix is normal in caliber, without evidence of appendicitis. The colon is grossly unremarkable in appearance. The bladder is mildly distended and grossly unremarkable. The uterus is grossly unremarkable in appearance. An intrauterine device is noted in expected position at the fundus of the uterus. The left ovary is somewhat larger than the right, without focal mass. Trace fluid within the pelvis is likely physiologic in nature. No inguinal lymphadenopathy is seen. No acute osseous abnormalities are identified. IMPRESSION: Mild to moderate right-sided hydronephrosis, with an obstructing 5 x 4 mm stone at the distal right ureter, 2 cm above the right vesicoureteral junction. Electronically Signed   By: Roanna Raider M.D.   On: 04/18/2015 06:44   I have personally reviewed and evaluated these images and lab results as part of my medical decision-making.    MDM   Final diagnoses:  Right ureteral stone    New Prescriptions   ONDANSETRON (ZOFRAN) 4 MG TABLET    Take 1 tablet (4 mg total) by mouth every 8 (eight) hours as needed for nausea or vomiting.   OXYCODONE-ACETAMINOPHEN (PERCOCET/ROXICET) 5-325 MG TABLET    Take 1 tablet by mouth every 4 (four) hours as needed for moderate pain or severe pain.   TAMSULOSIN (FLOMAX) 0.4 MG  CAPS CAPSULE    Take 1 po QD until you pass the stone.  Plan discharge  Devoria Albe, MD, Concha Pyo, MD 04/18/15 [phone]

## 2016-05-22 IMAGING — CT CT RENAL STONE PROTOCOL
2 of 3 series · 15 of 46 positions shown, 17 images · non-contrast
Comparison: None.

CLINICAL DATA: Acute onset of right periumbilical abdominal pain,
radiating to the right back. Initial encounter.

EXAM:
CT ABDOMEN AND PELVIS WITHOUT CONTRAST
TECHNIQUE: Multidetector CT imaging of the abdomen and pelvis was performed
following the standard protocol without IV contrast.

[Series 2: renal stone 5mm · axial · 0.63mm/px · z∈[-464,-64]mm · 12 of 92 slices shown, 14 images]
[im 6/92  soft-tissue]
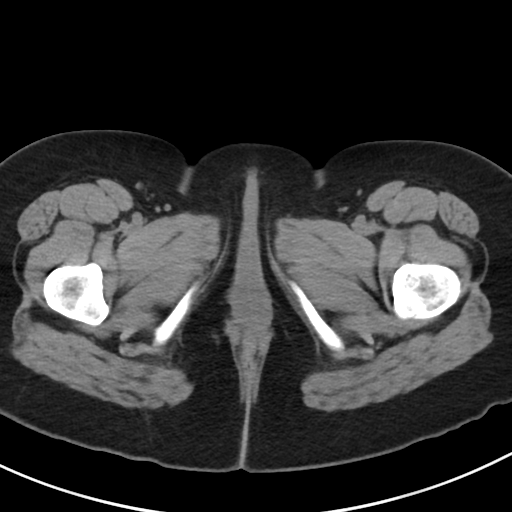
[im 6/92  bone]
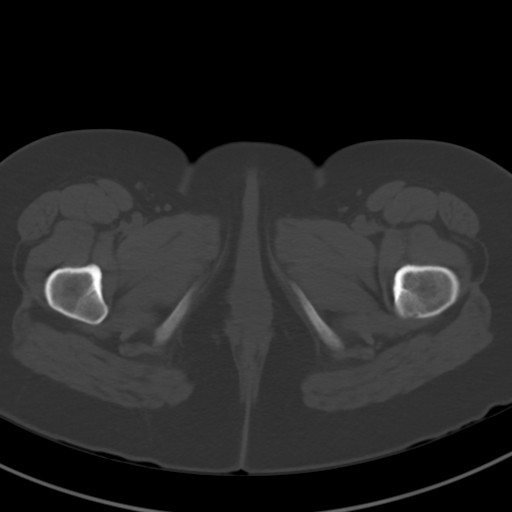
[im 12/92  soft-tissue]
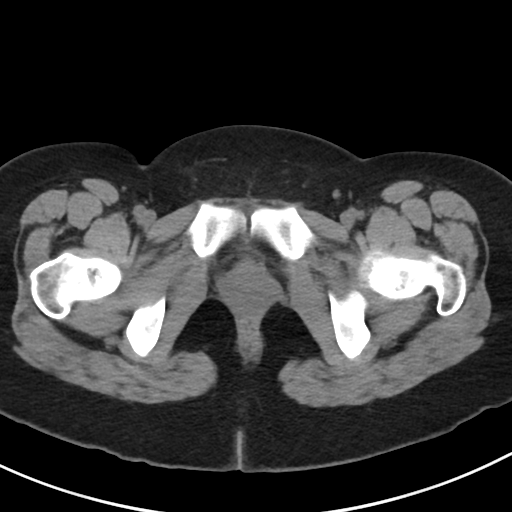
[im 21/92  soft-tissue]
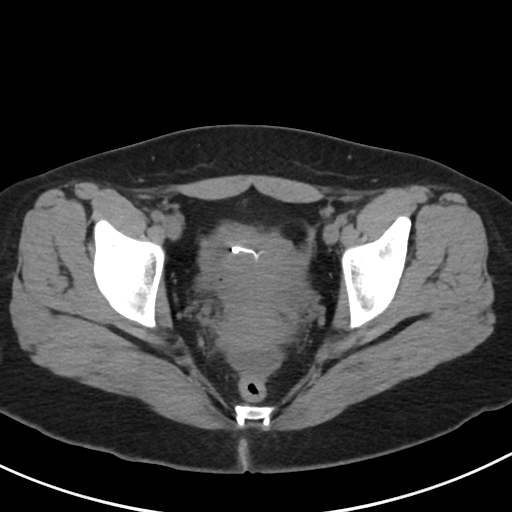
[im 27/92  soft-tissue]
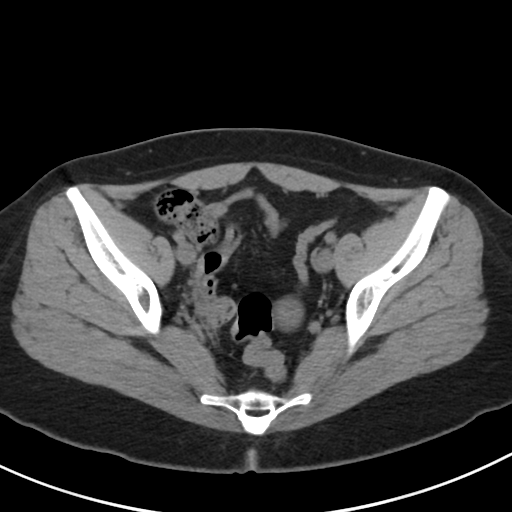
[im 36/92  soft-tissue]
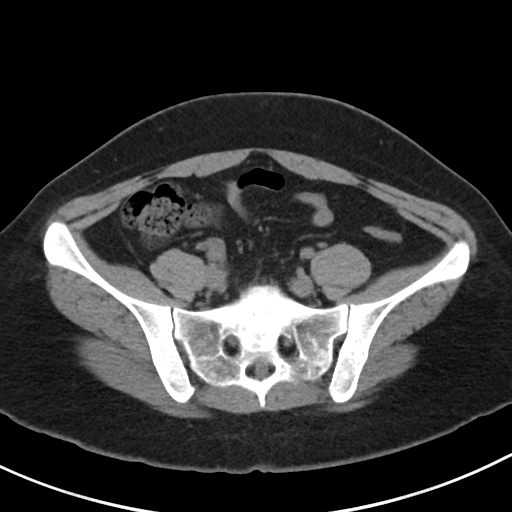
[im 42/92  soft-tissue]
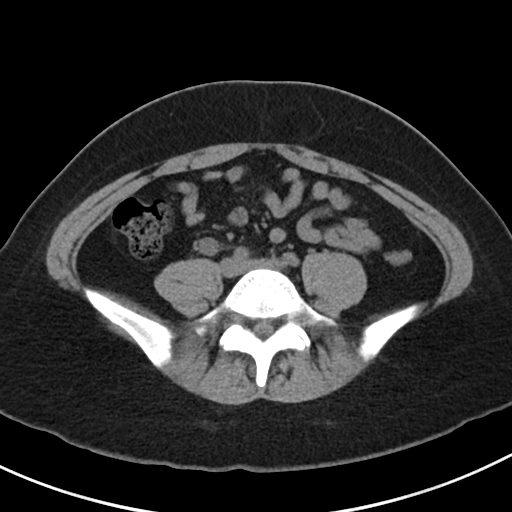
[im 50/92  soft-tissue]
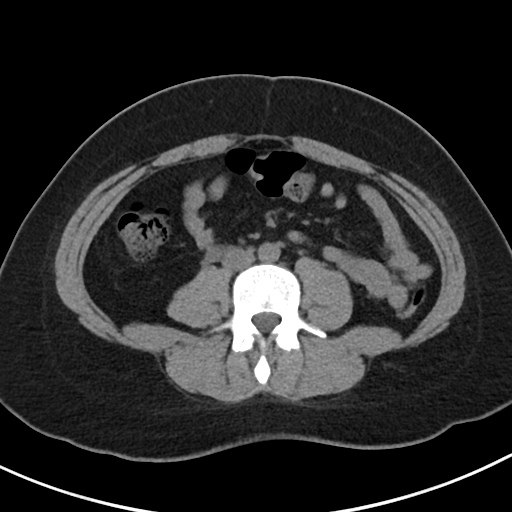
[im 56/92  soft-tissue]
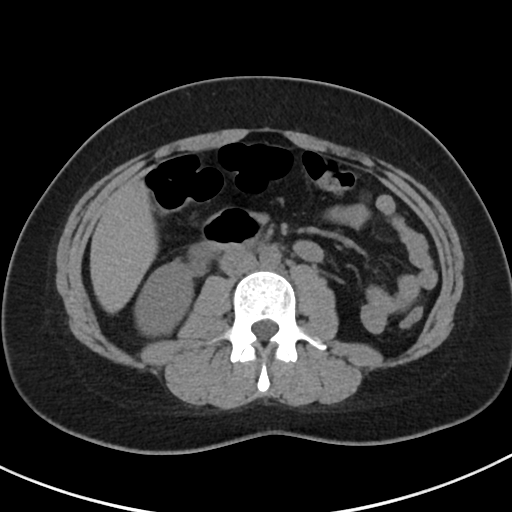
[im 65/92  soft-tissue]
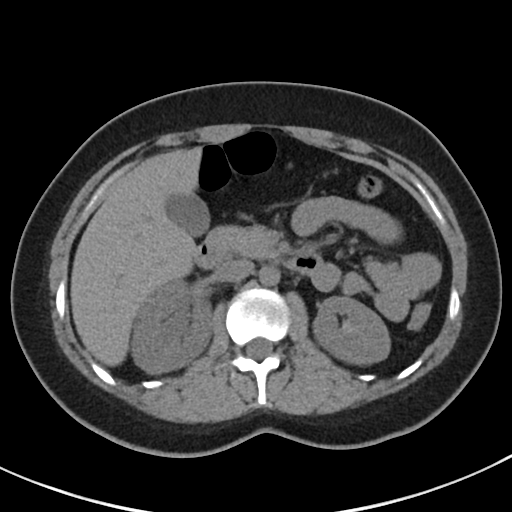
[im 65/92  bone]
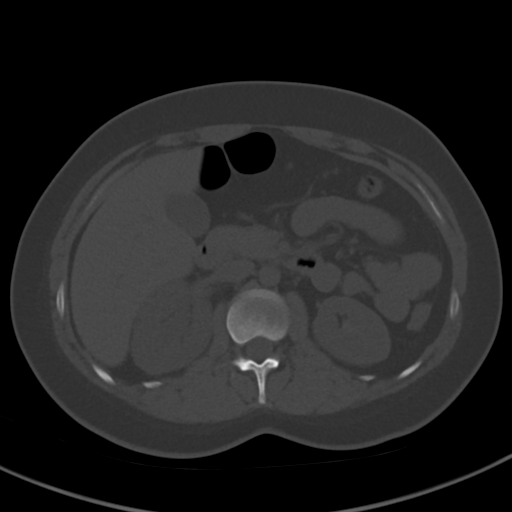
[im 71/92  soft-tissue]
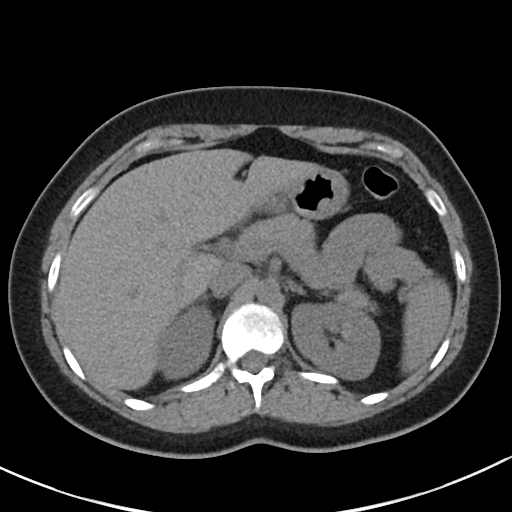
[im 80/92  soft-tissue]
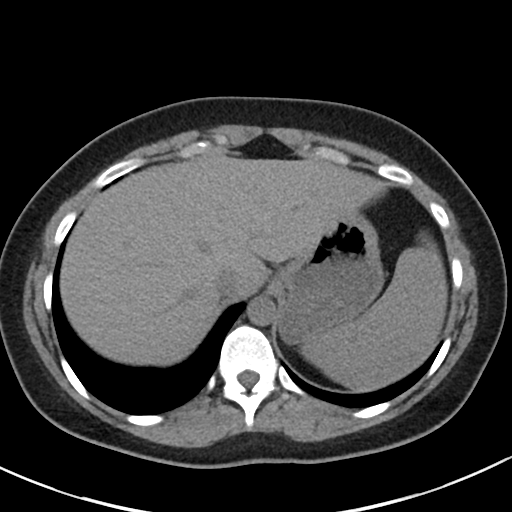
[im 86/92  soft-tissue]
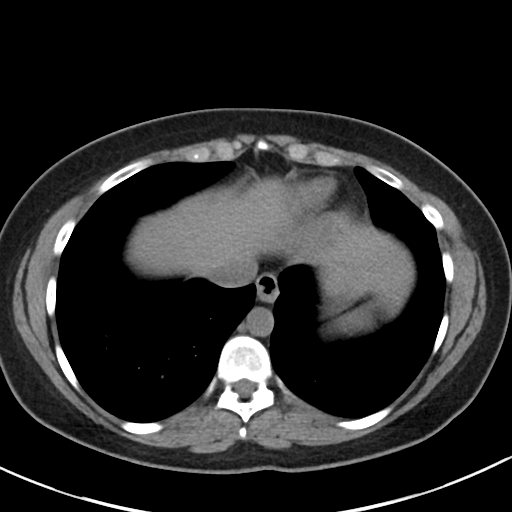

[Series 5: renal stone 3.0 cor · coronal · 0.59mm/px · 3 of 74 slices shown]
[im 25/74  soft-tissue]
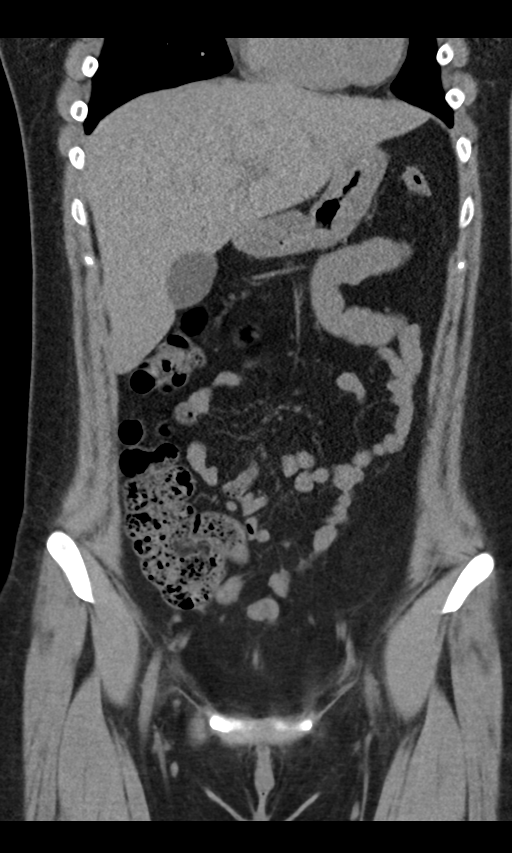
[im 33/74  soft-tissue]
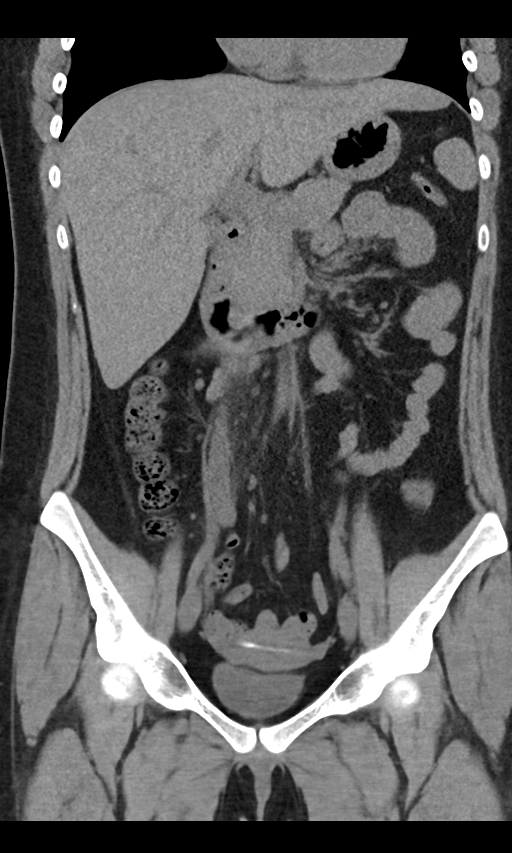
[im 41/74  soft-tissue]
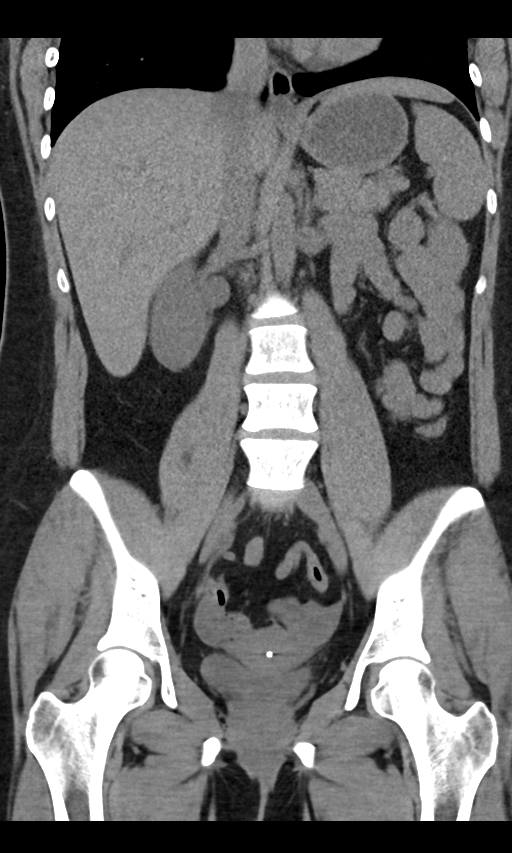

[15 of 46 positions shown; findings below may reference images not displayed]

FINDINGS: The visualized lung bases are clear.

The liver and spleen are unremarkable in appearance. The gallbladder
is within normal limits. The pancreas and adrenal glands are
unremarkable.

Mild-to-moderate right-sided hydronephrosis is noted, with
prominence of the right ureter along most of its course, and an
obstructing 5 x 4 mm stone at the distal right ureter, 2 cm above
the right vesicoureteral junction. The left kidney is grossly
unremarkable in appearance. Areas of minimally increased attenuation
within the renal calyces are nonspecific.

The small bowel is unremarkable in appearance. The stomach is within
normal limits. No acute vascular abnormalities are seen.

The appendix is normal in caliber, without evidence of appendicitis.
The colon is grossly unremarkable in appearance.

The bladder is mildly distended and grossly unremarkable. The uterus
is grossly unremarkable in appearance. An intrauterine device is
noted in expected position at the fundus of the uterus. The left
ovary is somewhat larger than the right, without focal mass. Trace
fluid within the pelvis is likely physiologic in nature. No inguinal
lymphadenopathy is seen.

No acute osseous abnormalities are identified.
IMPRESSION: Mild to moderate right-sided hydronephrosis, with an obstructing 5 x
4 mm stone at the distal right ureter, 2 cm above the right
vesicoureteral junction.

## 2016-08-29 ENCOUNTER — Encounter (HOSPITAL_BASED_OUTPATIENT_CLINIC_OR_DEPARTMENT_OTHER): Payer: Self-pay

## 2016-08-29 ENCOUNTER — Emergency Department (HOSPITAL_BASED_OUTPATIENT_CLINIC_OR_DEPARTMENT_OTHER)
Admission: EM | Admit: 2016-08-29 | Discharge: 2016-08-30 | Disposition: A | Discharge status: Home / Self Care | Payer: No Typology Code available for payment source | Attending: Emergency Medicine | Admitting: Emergency Medicine

## 2016-08-29 ENCOUNTER — Emergency Department (HOSPITAL_BASED_OUTPATIENT_CLINIC_OR_DEPARTMENT_OTHER): Payer: No Typology Code available for payment source

## 2016-08-29 DIAGNOSIS — S8001XA Contusion of right knee, initial encounter: Secondary | ICD-10-CM | POA: Diagnosis not present

## 2016-08-29 DIAGNOSIS — S20219A Contusion of unspecified front wall of thorax, initial encounter: Secondary | ICD-10-CM | POA: Insufficient documentation

## 2016-08-29 DIAGNOSIS — Y939 Activity, unspecified: Secondary | ICD-10-CM | POA: Diagnosis not present

## 2016-08-29 DIAGNOSIS — S20309A Unspecified superficial injuries of unspecified front wall of thorax, initial encounter: Secondary | ICD-10-CM | POA: Diagnosis present

## 2016-08-29 DIAGNOSIS — Y929 Unspecified place or not applicable: Secondary | ICD-10-CM | POA: Insufficient documentation

## 2016-08-29 DIAGNOSIS — Y999 Unspecified external cause status: Secondary | ICD-10-CM | POA: Insufficient documentation

## 2016-08-29 HISTORY — DX: Calculus of kidney: N20.0

## 2016-08-29 MED ORDER — IBUPROFEN 800 MG PO TABS
ORAL_TABLET | ORAL | Status: AC
  Filled 2016-08-29: qty 1

## 2016-08-29 MED ORDER — IBUPROFEN 800 MG PO TABS
800.0000 mg | ORAL_TABLET | Freq: Once | ORAL | Status: AC
  Administered 2016-08-29: 800 mg via ORAL

## 2016-08-29 MED ORDER — IBUPROFEN 100 MG/5ML PO SUSP
ORAL | Status: AC
  Filled 2016-08-29: qty 40
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
## 2016-08-29 NOTE — ED Notes (Signed)
Pt requested/received ice for LE

## 2016-08-29 NOTE — ED Notes (Signed)
Patient transported to X-ray 

## 2016-08-30 NOTE — Discharge Instructions (Signed)
Apply ice for 20 minutes every 2 hours while awake for the next 2 days.  Rest.  Weightbearing as tolerated.  Ibuprofen 600 mg every 6 hours as needed for pain.  Follow-up with your primary Dr. if you're not improving in the next week.

## 2018-08-15 ENCOUNTER — Inpatient Hospital Stay (HOSPITAL_COMMUNITY)
Admission: AD | Admit: 2018-08-15 | Discharge: 2018-08-15 | Disposition: A | Discharge status: Home / Self Care | Payer: Self-pay | Attending: Obstetrics & Gynecology | Admitting: Obstetrics & Gynecology

## 2018-08-15 ENCOUNTER — Other Ambulatory Visit: Payer: Self-pay

## 2018-08-15 DIAGNOSIS — Z3202 Encounter for pregnancy test, result negative: Secondary | ICD-10-CM | POA: Insufficient documentation

## 2018-08-15 LAB — POCT PREGNANCY, URINE: Preg Test, Ur: NEGATIVE

## 2018-08-15 LAB — HCG, SERUM, QUALITATIVE: Preg, Serum: NEGATIVE

## 2018-08-15 NOTE — MAU Provider Note (Signed)
First Provider Initiated Contact with Patient 08/15/18 1357      [name] is a 23 y.o. G48P1001 non-pregnant female who presents to MAU today with complaint of pregnancy confirmation. Pt denies any VB, abdominal pain or other symptoms. Pt reports she had a positive home pregnancy test two months ago.  O BP 137/63 (BP Location: Right Arm)   Pulse 89   Temp 98.4 F (36.9 C) (Oral)   Resp 18   Ht 5\' 4"  (1.626 m)   Wt 79.2 kg   LMP  (LMP Unknown)   SpO2 99%   BMI 29.99 kg/m  Physical Exam  Constitutional: She is oriented to person, place, and time. She appears well-developed and well-nourished. No distress.  HENT:  Head: Normocephalic and atraumatic.  Respiratory: Effort normal.  Neurological: She is alert and oriented to person, place, and time.  Skin: She is not diaphoretic.  Psychiatric: She has a normal mood and affect. Her behavior is normal. Judgment and thought content normal.   A Non pregnant female Medical screening exam complete  P Discharge from MAU in stable condition Patient given the option of transfer to [facility] for further evaluation or seek care in outpatient facility of choice List of options for follow-up given  Warning signs for worsening condition that would warrant emergency follow-up discussed Patient may return to MAU as needed for pregnancy related complaints  Juanita Devincent, Odie Sera, NP 08/15/2018 1:58 PM

## 2018-08-15 NOTE — MAU Note (Signed)
Pt reports to mau, for confirmation of pregnancy after having a pos. hpt 2 months ago.  Pt denies any pain at this time.  Pt also denies any vag bleeding.

## 2018-09-07 ENCOUNTER — Emergency Department (HOSPITAL_COMMUNITY)
Admission: EM | Admit: 2018-09-07 | Discharge: 2018-09-07 | Disposition: A | Discharge status: Home / Self Care | Payer: Self-pay | Attending: Emergency Medicine | Admitting: Emergency Medicine

## 2018-09-07 ENCOUNTER — Encounter (HOSPITAL_COMMUNITY): Payer: Self-pay | Admitting: Emergency Medicine

## 2018-09-07 DIAGNOSIS — Z5321 Procedure and treatment not carried out due to patient leaving prior to being seen by health care provider: Secondary | ICD-10-CM | POA: Insufficient documentation

## 2018-09-07 DIAGNOSIS — R51 Headache: Secondary | ICD-10-CM | POA: Insufficient documentation

## 2018-09-07 NOTE — ED Triage Notes (Signed)
Pt states for the last week she has had a headache that seems to come and go but hasn't completely went away. Pt has tried motrin with no relief. Denies fevers, cough.

## 2018-09-07 NOTE — ED Notes (Signed)
Pt called on cell phone due to being unable to find her- voice mail left.

## 2018-11-28 ENCOUNTER — Emergency Department (HOSPITAL_COMMUNITY)
Admission: EM | Admit: 2018-11-28 | Discharge: 2018-11-28 | Disposition: A | Discharge status: Home / Self Care | Payer: Self-pay | Attending: Emergency Medicine | Admitting: Emergency Medicine

## 2018-11-28 ENCOUNTER — Other Ambulatory Visit: Payer: Self-pay

## 2018-11-28 DIAGNOSIS — R21 Rash and other nonspecific skin eruption: Secondary | ICD-10-CM

## 2018-11-28 LAB — POC URINE PREG, ED: Preg Test, Ur: NEGATIVE

## 2018-11-28 MED ORDER — METHYLPREDNISOLONE SODIUM SUCC 125 MG IJ SOLR
125.0000 mg | Freq: Once | INTRAMUSCULAR | Status: AC
  Administered 2018-11-28: 125 mg via INTRAVENOUS
  Filled 2018-11-28: qty 2

## 2018-11-28 MED ORDER — PREDNISONE 20 MG PO TABS: 40.0000 mg | ORAL_TABLET | Freq: Every day | ORAL | 0 refills | Status: DC

## 2018-11-28 MED ORDER — FAMOTIDINE IN NACL 20-0.9 MG/50ML-% IV SOLN
20.0000 mg | Freq: Once | INTRAVENOUS | Status: AC
  Administered 2018-11-28: 20 mg via INTRAVENOUS
  Filled 2018-11-28: qty 50

## 2018-11-28 MED ORDER — DIPHENHYDRAMINE HCL 50 MG/ML IJ SOLN
25.0000 mg | Freq: Once | INTRAMUSCULAR | Status: AC
  Administered 2018-11-28: 25 mg via INTRAVENOUS
  Filled 2018-11-28: qty 1

## 2018-11-28 NOTE — ED Provider Notes (Signed)
[facility]   CSN: [id number] Arrival date & time: 11/28/18  1358     History   Chief Complaint Chief Complaint  Patient presents with  . Rash    HPI Alexandra Spencer is a 23 y.o. female with history of nephrolithiasis otherwise healthy presents today for pruritic rash that is been present for 1 day.  Patient reports that she had a gradual onset of rash yesterday diffusely throughout body, primarily to flexor areas.  She denies having similar rash in the past.  She denies any clear inciting factors, denies any new soaps, detergents, foods, environmental exposures or medications.  She has attempted Benadryl for her symptoms without relief.  Patient denies any feeling of throat closing, difficulty swallowing, difficulty breathing today.  Patient does report a mild amount of diarrhea that began today nonbloody.  She reports few episodes of small amount.  Patient denies fever/chills, headache/vision changes, feeling of throat closing, difficulty breathing, chest pain/shortness of breath, swelling of the neck or throat, dysuria/hematuria, nausea/vomiting, abdominal pain, swelling of the extremities or any additional concerns today.     HPI  Past Medical History:  Diagnosis Date  . Kidney stone   . Medical history non-contributory     Patient Active Problem List   Diagnosis Date Noted  . Vacuum extractor delivery, delivered 08/15/2014  . Indication for care in labor and delivery, antepartum 08/13/2014    Past Surgical History:  Procedure Laterality Date  . NO PAST SURGERIES       OB History    Gravida  1   Para  1   Term  1   Preterm      AB      Living  1     SAB      TAB      Ectopic      Multiple  0   Live Births  1            Home Medications    Prior to Admission medications   Not on File    Family History No family history on file.  Social History Social History   Tobacco Use  .  Smoking status: Never Smoker  . Smokeless tobacco: Never Used  Substance Use Topics  . Alcohol use: No  . Drug use: No     Allergies   Patient has no known allergies.   Review of Systems Review of Systems Ten systems are reviewed and are negative for acute change except as noted in the HPI   Physical Exam Updated Vital Signs BP 118/72 (BP Location: Left Arm)   Pulse 90   Temp 98.3 F (36.8 C) (Oral)   Resp 16   SpO2 98%   Physical Exam Constitutional:      General: She is not in acute distress.    Appearance: Normal appearance. She is well-developed. She is not ill-appearing or diaphoretic.  HENT:     Head: Normocephalic.     Jaw: There is normal jaw occlusion. No trismus.     Right Ear: External ear normal.     Left Ear: External ear normal.     Nose: Nose normal.     Mouth/Throat:     Comments: The patient has normal phonation and is in control of secretions. No stridor.  Midline uvula without edema. Soft palate rises symmetrically. No tonsillar erythema, swelling or exudates. Tongue protrusion is normal, floor of mouth is soft. No trismus. No creptius on  neck palpation. No gingival erythema or fluctuance noted. Mucus membranes moist. No pallor noted. Eyes:     General: Vision grossly intact. Gaze aligned appropriately.     Pupils: Pupils are equal, round, and reactive to light.  Neck:     Musculoskeletal: Normal range of motion.     Trachea: Trachea and phonation normal. No tracheal deviation.  Pulmonary:     Effort: Pulmonary effort is normal. No respiratory distress.  Abdominal:     General: There is no distension.     Palpations: Abdomen is soft.     Tenderness: There is no abdominal tenderness. There is no guarding or rebound.  Musculoskeletal: Normal range of motion.  Skin:    General: Skin is warm and dry.     Capillary Refill: Capillary refill takes less than 2 seconds.     Comments: Patient with diffuse urticaria primarily to flexor areas.   Neurological:     Mental Status: She is alert.     GCS: GCS eye subscore is 4. GCS verbal subscore is 5. GCS motor subscore is 6.     Comments: Speech is clear and goal oriented, follows commands Major Cranial nerves without deficit, no facial droop Moves extremities without ataxia, coordination intact  Psychiatric:        Behavior: Behavior normal.    ED Treatments / Results  Labs (all labs ordered are listed, but only abnormal results are displayed) Labs Reviewed  POC URINE PREG, ED    EKG None  Radiology No results found.  Procedures Procedures (including critical care time)  Medications Ordered in ED Medications  diphenhydrAMINE (BENADRYL) injection 25 mg (has no administration in time range)  famotidine (PEPCID) IVPB 20 mg premix (has no administration in time range)  methylPREDNISolone sodium succinate (SOLU-MEDROL) 125 mg/2 mL injection 125 mg (has no administration in time range)     Initial Impression / Assessment and Plan / ED Course  I have reviewed the triage vital signs and the nursing notes.  Pertinent labs & imaging results that were available during my care of the patient were reviewed by me and considered in my medical decision making (see chart for details).  23 year old female arrives today for allergic reaction with diffuse pruritic urticaria that began yesterday.  No known triggers or exposures.  No evidence of oral swelling or airway compromise.  No respiratory distress.  She does report some mild diarrhea that began today without abdominal pain nausea or vomiting.  She has no hypotension, tachycardia, fever or hypoxia on room air.  Will begin patient with Benadryl, Pepcid and Solu-Medrol here in the ED.  Patient will need observation and reassessment, I do not feel patient condition necessitates epinephrine at this time.  She is resting comfortably and in no acute distress.  Care handoff given to [facility], PA-C at shift change, plan of care at this  time is to reassess, disposition per oncoming team.    Note: Portions of this report may have been transcribed using voice recognition software. Every effort was made to ensure accuracy; however, inadvertent computerized transcription errors may still be present. Final Clinical Impressions(s) / ED Diagnoses   Final diagnoses:  None    ED Discharge Orders    None       Gari Crown 11/28/18 1624    Carmin Muskrat, MD 11/29/18 1407

## 2018-11-28 NOTE — Discharge Instructions (Addendum)
It was my pleasure taking care of you today!   Benadryl every 6 hours as needed for itching.  Take prednisone daily as directed starting tomorrow morning.    Return to the ER for difficulty breathing, return of allergic reaction or other concerning symptoms

## 2018-11-28 NOTE — ED Triage Notes (Signed)
Pt reports generalized rash that started yesterday that itches. Pt tried benadryl but it hasn't helped, no swelling or difficulty breathing.

## 2018-11-28 NOTE — ED Provider Notes (Signed)
Care assumed from previous provider PA [facility]. Please see note for further details. Case discussed, plan agreed upon. Will re-evaluate after med administration.   Patient evaluated.  Rash has now resolved.  She feels much better and would like to go home.  Would start her on short steroid burst.  Benadryl as needed.  Follow-up with PCP if needed.  Reasons to return to the emergency department were discussed with patient at length.  All questions were answered.   Davion Flannery, Ozella Almond, PA-C 11/28/18 1814    Gareth Morgan, MD 12/01/18 2012

## 2018-12-02 ENCOUNTER — Other Ambulatory Visit: Payer: Self-pay

## 2018-12-02 ENCOUNTER — Encounter (HOSPITAL_COMMUNITY): Payer: Self-pay | Admitting: Emergency Medicine

## 2018-12-02 ENCOUNTER — Emergency Department (HOSPITAL_COMMUNITY)
Admission: EM | Admit: 2018-12-02 | Discharge: 2018-12-02 | Discharge status: Against Medical Advice | Payer: Self-pay | Attending: Emergency Medicine | Admitting: Emergency Medicine

## 2018-12-02 DIAGNOSIS — Z5321 Procedure and treatment not carried out due to patient leaving prior to being seen by health care provider: Secondary | ICD-10-CM | POA: Insufficient documentation

## 2018-12-02 DIAGNOSIS — N939 Abnormal uterine and vaginal bleeding, unspecified: Secondary | ICD-10-CM | POA: Insufficient documentation

## 2018-12-02 LAB — CBC
HCT: 44.4 % (ref 36.0–46.0)
Hemoglobin: 13.9 g/dL (ref 12.0–15.0)
MCH: 29 pg (ref 26.0–34.0)
MCHC: 31.3 g/dL (ref 30.0–36.0)
MCV: 92.7 fL (ref 80.0–100.0)
Platelets: 385 10*3/uL (ref 150–400)
RBC: 4.79 MIL/uL (ref 3.87–5.11)
RDW: 12.3 % (ref 11.5–15.5)
WBC: 13.9 10*3/uL — ABNORMAL HIGH (ref 4.0–10.5)
nRBC: 0 % (ref 0.0–0.2)

## 2018-12-02 LAB — COMPREHENSIVE METABOLIC PANEL
ALT: 11 U/L (ref 0–44)
AST: 18 U/L (ref 15–41)
Albumin: 3.5 g/dL (ref 3.5–5.0)
Alkaline Phosphatase: 43 U/L (ref 38–126)
Anion gap: 9 (ref 5–15)
BUN: 6 mg/dL (ref 6–20)
CO2: 24 mmol/L (ref 22–32)
Calcium: 8.5 mg/dL — ABNORMAL LOW (ref 8.9–10.3)
Chloride: 106 mmol/L (ref 98–111)
Creatinine, Ser: 0.92 mg/dL (ref 0.44–1.00)
GFR calc Af Amer: >60 mL/min (ref >60)
GFR calc non Af Amer: >60 mL/min (ref >60)
Glucose, Bld: 102 mg/dL — ABNORMAL HIGH (ref 70–99)
Potassium: 3.9 mmol/L (ref 3.5–5.1)
Sodium: 139 mmol/L (ref 135–145)
Total Bilirubin: 0.9 mg/dL (ref 0.3–1.2)
Total Protein: 6.5 g/dL (ref 6.5–8.1)

## 2018-12-02 LAB — URINALYSIS, ROUTINE W REFLEX MICROSCOPIC
Bilirubin Urine: NEGATIVE
Glucose, UA: NEGATIVE mg/dL
Ketones, ur: NEGATIVE mg/dL
Nitrite: NEGATIVE
Protein, ur: NEGATIVE mg/dL
Specific Gravity, Urine: 1.009 (ref 1.005–1.030)
pH: 7 (ref 5.0–8.0)

## 2018-12-02 LAB — I-STAT BETA HCG BLOOD, ED (MC, WL, AP ONLY): I-stat hCG, quantitative: <5 m[IU]/mL (ref <5)

## 2018-12-02 LAB — LIPASE, BLOOD: Lipase: 30 U/L (ref 11–51)

## 2018-12-02 MED ORDER — SODIUM CHLORIDE 0.9% FLUSH: 3.0000 mL | Freq: Once | INTRAVENOUS | Status: DC

## 2018-12-02 NOTE — ED Notes (Signed)
PT left after asking for room status.

## 2018-12-02 NOTE — ED Notes (Signed)
Pt. Stated, I need to leave and pick up my daughter and I can't wait.

## 2018-12-02 NOTE — ED Triage Notes (Signed)
Ppt. Stated, Ive had vaginal bleeding and stomach pain for 3 weeks.

## 2018-12-03 ENCOUNTER — Emergency Department
Admission: EM | Admit: 2018-12-03 | Discharge: 2018-12-03 | Disposition: A | Discharge status: Home / Self Care | Payer: Self-pay | Attending: Student | Admitting: Student

## 2018-12-03 ENCOUNTER — Other Ambulatory Visit: Payer: Self-pay

## 2018-12-03 DIAGNOSIS — R109 Unspecified abdominal pain: Secondary | ICD-10-CM | POA: Insufficient documentation

## 2018-12-03 DIAGNOSIS — N760 Acute vaginitis: Secondary | ICD-10-CM | POA: Insufficient documentation

## 2018-12-03 DIAGNOSIS — B9689 Other specified bacterial agents as the cause of diseases classified elsewhere: Secondary | ICD-10-CM | POA: Insufficient documentation

## 2018-12-03 DIAGNOSIS — N939 Abnormal uterine and vaginal bleeding, unspecified: Secondary | ICD-10-CM | POA: Insufficient documentation

## 2018-12-03 LAB — LIPASE, BLOOD: Lipase: 31 U/L (ref 11–51)

## 2018-12-03 LAB — URINALYSIS, COMPLETE (UACMP) WITH MICROSCOPIC
Bilirubin Urine: NEGATIVE
Glucose, UA: NEGATIVE mg/dL
Ketones, ur: NEGATIVE mg/dL
Nitrite: NEGATIVE
Protein, ur: NEGATIVE mg/dL
Specific Gravity, Urine: 1.005 (ref 1.005–1.030)
pH: 6 (ref 5.0–8.0)

## 2018-12-03 LAB — WET PREP, GENITAL
Sperm: NONE SEEN
Trich, Wet Prep: NONE SEEN
Yeast Wet Prep HPF POC: NONE SEEN

## 2018-12-03 LAB — CBC
HCT: 41.3 % (ref 36.0–46.0)
Hemoglobin: 13.4 g/dL (ref 12.0–15.0)
MCH: 28.6 pg (ref 26.0–34.0)
MCHC: 32.4 g/dL (ref 30.0–36.0)
MCV: 88.1 fL (ref 80.0–100.0)
Platelets: 369 10*3/uL (ref 150–400)
RBC: 4.69 MIL/uL (ref 3.87–5.11)
RDW: 12.4 % (ref 11.5–15.5)
WBC: 12.3 10*3/uL — ABNORMAL HIGH (ref 4.0–10.5)
nRBC: 0 % (ref 0.0–0.2)

## 2018-12-03 LAB — COMPREHENSIVE METABOLIC PANEL
ALT: 11 U/L (ref 0–44)
AST: 12 U/L — ABNORMAL LOW (ref 15–41)
Albumin: 3.7 g/dL (ref 3.5–5.0)
Alkaline Phosphatase: 46 U/L (ref 38–126)
Anion gap: 10 (ref 5–15)
BUN: 8 mg/dL (ref 6–20)
CO2: 23 mmol/L (ref 22–32)
Calcium: 8.5 mg/dL — ABNORMAL LOW (ref 8.9–10.3)
Chloride: 106 mmol/L (ref 98–111)
Creatinine, Ser: 0.73 mg/dL (ref 0.44–1.00)
GFR calc Af Amer: >60 mL/min (ref >60)
GFR calc non Af Amer: >60 mL/min (ref >60)
Glucose, Bld: 127 mg/dL — ABNORMAL HIGH (ref 70–99)
Potassium: 3.9 mmol/L (ref 3.5–5.1)
Sodium: 139 mmol/L (ref 135–145)
Total Bilirubin: 0.5 mg/dL (ref 0.3–1.2)
Total Protein: 6.9 g/dL (ref 6.5–8.1)

## 2018-12-03 LAB — POCT PREGNANCY, URINE: Preg Test, Ur: NEGATIVE

## 2018-12-03 MED ORDER — NORGESTIMATE-ETH ESTRADIOL 0.25-35 MG-MCG PO TABS: 1.0000 | ORAL_TABLET | Freq: Every day | ORAL | 2 refills | Status: DC

## 2018-12-03 MED ORDER — METRONIDAZOLE 500 MG PO TABS: 500.0000 mg | ORAL_TABLET | Freq: Two times a day (BID) | ORAL | 0 refills | Status: AC

## 2018-12-03 MED ORDER — ONDANSETRON HCL 4 MG PO TABS: 4.0000 mg | ORAL_TABLET | Freq: Every day | ORAL | 0 refills | Status: DC | PRN

## 2018-12-03 NOTE — Discharge Instructions (Addendum)
Thank you for letting us take care of you in the emergency department today.   Please continue to take any regular, prescribed medications.   New medications we have prescribed:  - Sprintec birth control - to help with your bleeding  Please follow up with: - OB/GYN, information for [facility] is below  Please return to the ER for any new or worsening symptoms.

## 2018-12-03 NOTE — ED Notes (Signed)
Patient to stat desk in no acute distress asking about wait time. Patient given update on wait time. Patient verbalizes understanding.  

## 2018-12-03 NOTE — ED Triage Notes (Signed)
Reports bilateral lower abdominal pain and heavy vaginal bleeding X 2 weeks. Pt alert and oriented X4, cooperative, RR even and unlabored, color WNL. Pt in NAD.

## 2018-12-03 NOTE — ED Notes (Signed)
Pt states lower abd pain for two weeks with heavy vaginal bleeding. Pt states she has had a cough for two weeks. Pt's cough is dry. Pt denies fever. Pt states at times she feels lightheaded standing, but not continously. Pt with pwd skin and appears in noa cute distress.

## 2018-12-03 NOTE — ED Notes (Signed)
Pt set up for pelvic exam.

## 2018-12-03 NOTE — ED Provider Notes (Signed)
[facility] Emergency Department Provider Note  ____________________________________________   First MD Initiated Contact with Patient 12/03/18 1946     (approximate)  I have reviewed the triage vital signs and the nursing notes.  History  Chief Complaint Abdominal Pain    HPI Alexandra Spencer is a 23 y.o. female G1P1001 who presents for abnormal vaginal bleeding. LMP 8/17-20. Then had vaginal bleeding again that started several days later on 8/24, and has been bleeding daily since then.  Needs several tampons daily, but does not have to change on an hourly basis.  She states this is atypical for her, as normally she has very regular periods.  She denies any chance of pregnancy.  She denies any other vaginal discharge.  No concern for STDs.  No lightheadedness or syncope. No chest pain or trouble breathing.   No history of bleeding disorders, or abnormal bleeding.  Associated with lower abdominal cramping, bilaterally. Feels like menstrual cramps.  Mild in severity.  No laterality.  No hx of migraines, high BP, or blood clots.          Past Medical Hx Past Medical History:  Diagnosis Date  . Kidney stone   . Medical history non-contributory     Problem List Patient Active Problem List   Diagnosis Date Noted  . Vacuum extractor delivery, delivered 08/15/2014  . Indication for care in labor and delivery, antepartum 08/13/2014    Past Surgical Hx Past Surgical History:  Procedure Laterality Date  . NO PAST SURGERIES      Medications Prior to Admission medications   Medication Sig Start Date End Date Taking? Authorizing Provider  predniSONE (DELTASONE) 20 MG tablet Take 2 tablets (40 mg total) by mouth daily. 11/28/18   Ward, Chase PicketJaime Pilcher, PA-C    Allergies Patient has no known allergies.  Family Hx No family history on file.  Social Hx Social History   Tobacco Use  . Smoking status: Never Smoker  . Smokeless tobacco: Never Used   Substance Use Topics  . Alcohol use: No  . Drug use: No     Review of Systems  Constitutional: Negative for fever. Negative for chills. Eyes: Negative for visual changes. ENT: Negative for sore throat. Cardiovascular: Negative for chest pain. Respiratory: Negative for shortness of breath. Gastrointestinal: Negative for abdominal pain. Negative for nausea. Negative for vomiting. Genitourinary: Negative for dysuria. + vaginal bleeding Musculoskeletal: Negative for leg swelling. Skin: Negative for rash. Neurological: Negative for for headaches.   Physical Exam  Vital Signs: ED Triage Vitals  Enc Vitals Group     BP 12/03/18 1506 125/67     Pulse Rate 12/03/18 1506 (!) 103     Resp 12/03/18 1506 18     Temp 12/03/18 1506 98.6 F (37 C)     Temp Source 12/03/18 1506 Oral     SpO2 12/03/18 1506 99 %     Weight 12/03/18 1506 165 lb (74.8 kg)     Height 12/03/18 1506 5\' 3"  (1.6 m)     Head Circumference --      Peak Flow --      Pain Score 12/03/18 1510 8     Pain Loc --      Pain Edu? --      Excl. in GC? --     Constitutional: Alert and oriented.  Eyes: Conjunctivae clear. Sclera anicteric. Head: Normocephalic. Atraumatic. Nose: No congestion. No rhinorrhea. Mouth/Throat: Mucous membranes are moist.  Neck: No stridor.   Cardiovascular:  Normal rate, regular rhythm. No murmurs. Extremities well perfused. Respiratory: Normal respiratory effort.  Lungs CTAB. Gastrointestinal: Soft and non-tender, no rebound or guarding. No distention.  Pelvic: RN chaperone present, small amount of dark blood/mucus in vaginal canal.  No clots.  No active bleeding.  No CMT or adnexal tenderness. Musculoskeletal: No lower extremity edema. Neurologic:  Normal speech and language. No gross focal neurologic deficits are appreciated.  Skin: Skin is warm, dry and intact. No rash noted. Psychiatric: Mood and affect are appropriate for situation.  EKG  N/A   Radiology  N/A   Procedures   Procedure(s) performed (including critical care):  Procedures   Initial Impression / Assessment and Plan / ED Course  23 y.o. female who presents to the ED for vaginal bleeding, as above.  Ddx: Pregnancy, miscarriage,, menorrhagia, metrorrhagia  Plan: labs, pelvic exam with swabs, urine  Work-up reveals hemoglobin within normal limits.  Negative urine pregnancy.  No evidence of UTI.  We will plan for a course of Sprintec to help with her irregular vaginal bleeding -- she has no history of migraines, hypertension, DVT/PE, or tobacco use, and is agreeable with this plan.  Advised further follow-up with OB/GYN as outpatient, given referral.  Will treat pelvic exam swabs if needed, pending results.  Otherwise, patient stable for discharge, all questions answered, given return precautions.    Final Clinical Impression(s) / ED Diagnosis  Final diagnoses:  Vaginal bleeding  Abdominal cramping       Note:  This document was prepared using Dragon voice recognition software and may include unintentional dictation errors.   Lilia Pro., MD 12/03/18 2124

## 2018-12-06 ENCOUNTER — Telehealth: Payer: Self-pay | Admitting: Obstetrics and Gynecology

## 2018-12-06 NOTE — Telephone Encounter (Signed)
[facility] ED faxed pt's ED visit notes to our office/ pulled pt's chart to document. ED visit notes put in Lead physician basket. Thank you.

## 2018-12-07 LAB — GC/CHLAMYDIA PROBE AMP
Chlamydia trachomatis, NAA: POSITIVE — AB
Neisseria Gonorrhoeae by PCR: NEGATIVE

## 2018-12-09 ENCOUNTER — Telehealth: Payer: Self-pay | Admitting: Emergency Medicine

## 2018-12-09 NOTE — Telephone Encounter (Signed)
Called patient to inform of std test results with positive chlamydia.  Explained need for treatment and partner treatment--and available free treatmnent at [facility].  I  called in azithromycin 1 gram po per dr Cinda Quest to [facility].

## 2018-12-15 ENCOUNTER — Ambulatory Visit (INDEPENDENT_AMBULATORY_CARE_PROVIDER_SITE_OTHER): Payer: Self-pay | Admitting: Certified Nurse Midwife

## 2018-12-15 ENCOUNTER — Encounter: Payer: Self-pay | Admitting: Certified Nurse Midwife

## 2018-12-15 ENCOUNTER — Other Ambulatory Visit: Payer: Self-pay

## 2018-12-15 VITALS — BP 97/63 | HR 75 | Ht 63.0 in | Wt 162.4 lb

## 2018-12-15 DIAGNOSIS — N939 Abnormal uterine and vaginal bleeding, unspecified: Secondary | ICD-10-CM

## 2018-12-15 NOTE — Patient Instructions (Signed)
Abnormal Uterine Bleeding °Abnormal uterine bleeding means bleeding more than usual from your uterus. It can include: °· Bleeding between periods. °· Bleeding after sex. °· Bleeding that is heavier than normal. °· Periods that last longer than usual. °· Bleeding after you have stopped having your period (menopause). °There are many problems that may cause this. You should see a doctor for any kind of bleeding that is not normal. Treatment depends on the cause of the bleeding. °Follow these instructions at home: °· Watch your condition for any changes. °· Do not use tampons, douche, or have sex, if your doctor tells you not to. °· Change your pads often. °· Get regular well-woman exams. Make sure they include a pelvic exam and cervical cancer screening. °· Keep all follow-up visits as told by your doctor. This is important. °Contact a doctor if: °· The bleeding lasts more than one week. °· You feel dizzy at times. °· You feel like you are going to throw up (nauseous). °· You throw up. °Get help right away if: °· You pass out. °· You have to change pads every hour. °· You have belly (abdominal) pain. °· You have a fever. °· You get sweaty. °· You get weak. °· You passing large blood clots from your vagina. °Summary °· Abnormal uterine bleeding means bleeding more than usual from your uterus. °· There are many problems that may cause this. You should see a doctor for any kind of bleeding that is not normal. °· Treatment depends on the cause of the bleeding. °This information is not intended to replace advice given to you by your health care provider. Make sure you discuss any questions you have with your health care provider. °Document Released: 01/05/2009 Document Revised: 03/04/2016 Document Reviewed: 03/04/2016 °Elsevier Patient Education © 2020 Elsevier Inc. ° °

## 2018-12-15 NOTE — Progress Notes (Signed)
GYN ENCOUNTER NOTE  Subjective:       Alexandra Spencer is a 23 y.o. G92P1001 female is here for gynecologic evaluation of the following issues:  1. Follow up for the ED. Pt was seen for heavy bleeding and was found to have chlamydia. She was treated and has since stopped bleeding. She  States that her partner was treated as well. She was given sprintec birth control but never started.      Gynecologic History Patient's last menstrual period was 11/08/2018 (exact date). Contraception: none Last Pap: 5 yrs ago . Results were: normal per pt Last mammogram: n/a .   Obstetric History OB History  Gravida Para Term Preterm AB Living  1 1 1     1   SAB TAB Ectopic Multiple Live Births        0 1    # Outcome Date GA Lbr Len/2nd Weight Sex Delivery Anes PTL Lv  1 Term 08/14/14 [redacted]w[redacted]d 08:17 / 02:01 7 lb 1.8 oz (3.226 kg) F Vag-Spont EPI  LIV    Past Medical History:  Diagnosis Date  . Chlamydia   . Kidney stone   . Medical history non-contributory     Past Surgical History:  Procedure Laterality Date  . NO PAST SURGERIES      Current Outpatient Medications on File Prior to Visit  Medication Sig Dispense Refill  . norgestimate-ethinyl estradiol (SPRINTEC 28) 0.25-35 MG-MCG tablet Take 1 tablet by mouth daily. (Patient not taking: Reported on 12/15/2018) 1 Package 2  . ondansetron (ZOFRAN) 4 MG tablet Take 1 tablet (4 mg total) by mouth daily as needed for nausea or vomiting. (Patient not taking: Reported on 12/15/2018) 14 tablet 0  . predniSONE (DELTASONE) 20 MG tablet Take 2 tablets (40 mg total) by mouth daily. (Patient not taking: Reported on 12/15/2018) 10 tablet 0   No current facility-administered medications on file prior to visit.     No Known Allergies  Social History   Socioeconomic History  . Marital status: Single    Spouse name: Not on file  . Number of children: Not on file  . Years of education: Not on file  . Highest education level: Not on file  Occupational History   . Not on file  Social Needs  . Financial resource strain: Not on file  . Food insecurity    Worry: Not on file    Inability: Not on file  . Transportation needs    Medical: Not on file    Non-medical: Not on file  Tobacco Use  . Smoking status: Never Smoker  . Smokeless tobacco: Never Used  Substance and Sexual Activity  . Alcohol use: No  . Drug use: No  . Sexual activity: Not Currently    Birth control/protection: None  Lifestyle  . Physical activity    Days per week: Not on file    Minutes per session: Not on file  . Stress: Not on file  Relationships  . Social 12/17/2018 on phone: Not on file    Gets together: Not on file    Attends religious service: Not on file    Active member of club or organization: Not on file    Attends meetings of clubs or organizations: Not on file    Relationship status: Not on file  . Intimate partner violence    Fear of current or ex partner: Not on file    Emotionally abused: Not on file    Physically abused:  Not on file    Forced sexual activity: Not on file  Other Topics Concern  . Not on file  Social History Narrative  . Not on file    Family History  Problem Relation Age of Onset  . Breast cancer Mother   . Breast cancer Maternal Grandmother     The following portions of the patient's history were reviewed and updated as appropriate: allergies, current medications, past family history, past medical history, past social history, past surgical history and problem list.  Review of Systems Review of Systems - Negative except as mentioned in HPI Review of Systems - General ROS: negative for - chills, fatigue, fever, hot flashes, malaise or night sweats Hematological and Lymphatic ROS: negative for - bleeding problems or swollen lymph nodes Gastrointestinal ROS: negative for - abdominal pain, blood in stools, change in bowel habits and nausea/vomiting Musculoskeletal ROS: negative for - joint pain, muscle pain or  muscular weakness Genito-Urinary ROS: negative for - change in menstrual cycle, dysmenorrhea, dyspareunia, dysuria, genital discharge, genital ulcers, hematuria, incontinence, irregular/heavy menses, nocturia or pelvic pain  Objective:   BP 97/63   Pulse 75   Ht 5\' 3"  (1.6 m)   Wt 162 lb 7 oz (73.7 kg)   LMP 11/08/2018 (Exact Date)   BMI 28.77 kg/m  CONSTITUTIONAL: Well-developed, well-nourished female in no acute distress.  HENT:  Normocephalic, atraumatic.  NECK: Normal range of motion, supple, no masses.  Normal thyroid.  SKIN: Skin is warm and dry. No rash noted. Not diaphoretic. No erythema. No pallor. Ralls: Alert and oriented to person, place, and time. PSYCHIATRIC: Normal mood and affect. Normal behavior. Normal judgment and thought content. CARDIOVASCULAR:Not Examined RESPIRATORY: Not Examined BREASTS: Not Examined ABDOMEN: Soft, non distended; Non tender.  No Organomegaly. PELVIC:deferred, pt declines  MUSCULOSKELETAL: Normal range of motion. No tenderness.  No cyanosis, clubbing, or edema.     Assessment:   Abnormal uterine bleeding History chlamydia    Plan:    Reviewed probable cause of bleeding due to active chlamydia infections. Discussed having partner treated. Encouraged safe sex practices and encouraged annual well women exam with pap smear. Discussed pelvic u/s to r/o for other cause of the bleeding.  She declines additional testing and visits at this time stating that she does not have health insurance . Pt directed to contact health department for future STD testing and pap smear. She verbalizes and agrees to plan.   I attest more than 50% of visit spent reviewing history , discussing plan of care, reviewing safe sex practices. Face to face time 10 min.   Philip Aspen, CNM

## 2019-03-25 NOTE — L&D Delivery Note (Signed)
Delivery Note Pushed well with some decels/bradycardia for 35 min for delivery.  At 10:35 PM a viable and healthy female was delivered via Vaginal, Spontaneous (Presentation: Right Occiput Anterior).  APGAR: 9, 9; weight  P .   Placenta status: Spontaneous, Intact.  Cord: 3 vessels with the following complications:  none.    Anesthesia: Epidural Episiotomy:  no Lacerations:  none Suture Repair: N/A Est. Blood Loss (mL): 130  Mom to postpartum.  Baby to Couplet care / Skin to Skin.  Alexandra Spencer 02/18/2020, 10:57 PM  Br/Bo; A+/RI/Contra?/Tdap in [facility]

## 2019-05-16 ENCOUNTER — Ambulatory Visit: Payer: Self-pay | Admitting: Physician Assistant

## 2019-05-16 ENCOUNTER — Other Ambulatory Visit: Payer: Self-pay

## 2019-05-16 DIAGNOSIS — Z113 Encounter for screening for infections with a predominantly sexual mode of transmission: Secondary | ICD-10-CM

## 2019-05-16 DIAGNOSIS — N739 Female pelvic inflammatory disease, unspecified: Secondary | ICD-10-CM

## 2019-05-16 LAB — WET PREP FOR TRICH, YEAST, CLUE
Trichomonas Exam: NEGATIVE
Yeast Exam: NEGATIVE

## 2019-05-16 LAB — PREGNANCY, URINE: Preg Test, Ur: NEGATIVE

## 2019-05-16 MED ORDER — AZITHROMYCIN 500 MG PO TABS
1000.0000 mg | ORAL_TABLET | ORAL | Status: AC
  Administered 2019-05-16: 1000 mg via ORAL

## 2019-05-16 MED ORDER — CEFTRIAXONE SODIUM 250 MG IJ SOLR
500.0000 mg | Freq: Once | INTRAMUSCULAR | Status: AC
  Administered 2019-05-16: 17:00:00 500 mg via INTRAMUSCULAR

## 2019-05-17 ENCOUNTER — Encounter: Payer: Self-pay | Admitting: Physician Assistant

## 2019-05-17 NOTE — Progress Notes (Signed)
[facility] Department STI clinic/screening visit  Subjective:  Alexandra Spencer is a 24 y.o. female being seen today for an STI screening visit. The patient reports they do have symptoms.  Patient reports that they would be ok if she were to have  a pregnancy in the next year.   They reported they are not interested in discussing contraception today.  No LMP recorded. (Menstrual status: Irregular Periods).   Patient has the following medical conditions:   Patient Active Problem List   Diagnosis Date Noted  . Vacuum extractor delivery, delivered 08/15/2014  . Indication for care in labor and delivery, antepartum 08/13/2014    Chief Complaint  Patient presents with  . SEXUALLY TRANSMITTED DISEASE    HPI  Patient reports that she had Chlamydia that was treated about 4 months ago but she vomited < 1 hr after taking the medicine and never went back for re-treatment.  Reports that she has been having abdominal cramping off and on for 2-3 weeks and pain with sex for that same amount of time.  LMP 04/23/2019 and normal.  Not using any BCM and would be ok if a pregnancy were to occur.    See flowsheet for further details and programmatic requirements.    The following portions of the patient's history were reviewed and updated as appropriate: allergies, current medications, past medical history, past social history, past surgical history and problem list.  Objective:  There were no vitals filed for this visit.  Physical Exam Constitutional:      General: She is not in acute distress.    Appearance: Normal appearance. She is normal weight.  HENT:     Head: Normocephalic and atraumatic.     Comments: No nits, lice or hair loss. No cervical, supraclavicular or axillary adenopathy.    Mouth/Throat:     Mouth: Mucous membranes are moist.     Pharynx: Oropharynx is clear. No oropharyngeal exudate or posterior oropharyngeal erythema.  Eyes:     Conjunctiva/sclera: Conjunctivae  normal.  Pulmonary:     Effort: Pulmonary effort is normal.  Abdominal:     Palpations: Abdomen is soft. There is no mass.     Tenderness: There is no abdominal tenderness. There is no guarding or rebound.  Genitourinary:    General: Normal vulva.     Rectum: Normal.     Comments: External genitalia/pubic area without nits, lice, edema, erythema, lesions and inguinal adenopathy. Vagina with normal mucosa and discharge, on the left side when facing patient there is a ~1.5 cm x 0.5 cm possible cyst with grayish area in middle, nt, no ulceration. Cervix without visible lesions, + CMT. Uterus firm, mobile, tender to palpation and movement, no adnexal tenderness or fullness. Musculoskeletal:     Cervical back: Neck supple. No tenderness.  Skin:    General: Skin is warm and dry.     Findings: No bruising, erythema or rash.     Comments: Multiple tattoos.  Neurological:     Mental Status: She is alert and oriented to person, place, and time.  Psychiatric:        Mood and Affect: Mood normal.        Behavior: Behavior normal.        Thought Content: Thought content normal.        Judgment: Judgment normal.      Assessment and Plan:  Alexandra Spencer is a 24 y.o. female presenting to the [facility] Department for STI screening  1. Screening for STD (sexually transmitted disease) Patient into clinic with symptoms.  Declines blood work today. Rec condoms with all sex. Await test results.  Counseled that RN will call if needs to RTC for further treatment once results are back. Counseled likely not treated for Chlamydia due to vomiting meds < 1 hr after treatment and that partner needs evaluation. - Pregnancy, urine - WET PREP FOR TRICH, YEAST, CLUE - Gonococcus culture - Chlamydia/Gonorrhea [facility] - Gonococcus culture  2. Pelvic inflammatory disease Will treat for PID with Ceftriaxone 500mg  IM today and Azithromycin 1g po DOT today and a second dose in 2 weeks due to  possibility of pregnancy. To ER if symptoms worsen, fever, chills, nausea and vomiting. RTC in 2 days for recheck and 1 week for second dose of Azithromycin. - cefTRIAXone (ROCEPHIN) injection 500 mg - azithromycin (ZITHROMAX) tablet 1,000 mg     Return in about 2 days (around 05/18/2019) for pelvic recheck.  Future Appointments  Date Time Provider [place]  05/18/2019 11:00 AM [facility]I PROVIDER [facility] None    Jerene Dilling, [place]

## 2019-05-18 ENCOUNTER — Other Ambulatory Visit: Payer: Self-pay

## 2019-05-18 ENCOUNTER — Encounter: Payer: Self-pay | Admitting: Physician Assistant

## 2019-05-18 ENCOUNTER — Ambulatory Visit: Payer: Self-pay | Admitting: Physician Assistant

## 2019-05-18 DIAGNOSIS — N739 Female pelvic inflammatory disease, unspecified: Secondary | ICD-10-CM

## 2019-05-18 DIAGNOSIS — Z09 Encounter for follow-up examination after completed treatment for conditions other than malignant neoplasm: Secondary | ICD-10-CM

## 2019-05-18 NOTE — Progress Notes (Signed)
S:  Patient RTC for PID recheck today.  States that she is feeling better, though she is still having some cramping.  Patient asked partner to come for appointment to hear explanation about PID.   O:  WDWN female in NAD, A&O x3, pleasant and cooperative.  Skin= warm and dry without lesions; abdomen= soft, nt, no masses, guarding or rebound; bimanual=no CMT, uterus firm, mobile, nt, no masses, no adnexal tenderness or fullness.  Lab tests done 05/16/2019 still pending. A/P:  1.  PID resolving with treatment. 2.  Reviewed with patient and partner PID- causes, sequelae if not treated and treatments. 3.  Counseled patient to not have sex until after partner completes treatment as a contact. 4.  Appointment made for patient to RTC for second dose of Azithromycin 1g po DOT on 05/23/2019. 5.  Patient's partner left after explanation and before exam.  Per patient, partner would like Rx that he needs to take called to his Pharmacy, [facility] in [place].  Patient gave partner's name, DOB and that he is not allergic to any meds.  Rx for Cefixime 400mg  #2 to take po at one time with no refills and Doxycycline 100mg  #14 1 po BID for 7 days with no refills called in per request.

## 2019-05-18 NOTE — Progress Notes (Signed)
Client presents for follow-up after 05/16/2019 STD appt. Per client, did not vomit Azithromycin given at that appt. Jossie Ng, RN

## 2019-05-20 LAB — GONOCOCCUS CULTURE

## 2019-05-23 ENCOUNTER — Other Ambulatory Visit: Payer: Self-pay

## 2019-05-23 ENCOUNTER — Ambulatory Visit: Payer: Self-pay

## 2019-05-23 DIAGNOSIS — Z719 Counseling, unspecified: Secondary | ICD-10-CM

## 2019-05-23 NOTE — Progress Notes (Signed)
Pt requested TR for chlamydia testing on 05/16/2019. Pt informed that GC and chlamydia results showed as negative. Pt counseled that provider notes from 05/18/19 PID recheck visit had her coming today for Azithromycin 1 gram. Pt refused medication after hearing chlamydia result was negative. Pt counseled that provider is treating her for PID infection, a person can be diagnosed with PID without having chlamydia, and PID can require a longer course of antibiotic treatment. Pt refuses Azithromycin 1 gram today. Provider notified.

## 2019-05-23 NOTE — Progress Notes (Signed)
Reviewed note by RN where patient declined second dose of Azithromycin  to complete treatment for PID.  RN counseled patient re:  need for treatment despite neg test results and patient still declined medicine.  Aware of sequelae per RN note.

## 2019-06-30 ENCOUNTER — Encounter (HOSPITAL_COMMUNITY): Payer: Self-pay | Admitting: Obstetrics and Gynecology

## 2019-06-30 ENCOUNTER — Inpatient Hospital Stay (HOSPITAL_COMMUNITY)
Admission: AD | Admit: 2019-06-30 | Discharge: 2019-06-30 | Disposition: A | Discharge status: Home / Self Care | Payer: Medicaid Other | Attending: Obstetrics and Gynecology | Admitting: Obstetrics and Gynecology

## 2019-06-30 ENCOUNTER — Other Ambulatory Visit: Payer: Self-pay

## 2019-06-30 ENCOUNTER — Inpatient Hospital Stay (HOSPITAL_COMMUNITY): Payer: Medicaid Other

## 2019-06-30 DIAGNOSIS — Z3491 Encounter for supervision of normal pregnancy, unspecified, first trimester: Secondary | ICD-10-CM

## 2019-06-30 DIAGNOSIS — R109 Unspecified abdominal pain: Secondary | ICD-10-CM | POA: Diagnosis not present

## 2019-06-30 DIAGNOSIS — O26891 Other specified pregnancy related conditions, first trimester: Secondary | ICD-10-CM | POA: Diagnosis not present

## 2019-06-30 DIAGNOSIS — O219 Vomiting of pregnancy, unspecified: Secondary | ICD-10-CM | POA: Diagnosis not present

## 2019-06-30 DIAGNOSIS — Z3A01 Less than 8 weeks gestation of pregnancy: Secondary | ICD-10-CM | POA: Insufficient documentation

## 2019-06-30 DIAGNOSIS — R12 Heartburn: Secondary | ICD-10-CM

## 2019-06-30 LAB — URINALYSIS, ROUTINE W REFLEX MICROSCOPIC
Bilirubin Urine: NEGATIVE
Glucose, UA: NEGATIVE mg/dL
Hgb urine dipstick: NEGATIVE
Ketones, ur: NEGATIVE mg/dL
Leukocytes,Ua: NEGATIVE
Nitrite: NEGATIVE
Protein, ur: NEGATIVE mg/dL
Specific Gravity, Urine: 1.014 (ref 1.005–1.030)
pH: 9 — ABNORMAL HIGH (ref 5.0–8.0)

## 2019-06-30 LAB — COMPREHENSIVE METABOLIC PANEL
ALT: 13 U/L (ref 0–44)
AST: 15 U/L (ref 15–41)
Albumin: 3.9 g/dL (ref 3.5–5.0)
Alkaline Phosphatase: 41 U/L (ref 38–126)
Anion gap: 11 (ref 5–15)
BUN: <5 mg/dL — ABNORMAL LOW (ref 6–20)
CO2: 22 mmol/L (ref 22–32)
Calcium: 8.9 mg/dL (ref 8.9–10.3)
Chloride: 104 mmol/L (ref 98–111)
Creatinine, Ser: 0.8 mg/dL (ref 0.44–1.00)
GFR calc Af Amer: >60 mL/min (ref >60)
GFR calc non Af Amer: >60 mL/min (ref >60)
Glucose, Bld: 96 mg/dL (ref 70–99)
Potassium: 3.9 mmol/L (ref 3.5–5.1)
Sodium: 137 mmol/L (ref 135–145)
Total Bilirubin: 0.7 mg/dL (ref 0.3–1.2)
Total Protein: 6.6 g/dL (ref 6.5–8.1)

## 2019-06-30 LAB — HCG, QUANTITATIVE, PREGNANCY: hCG, Beta Chain, Quant, S: 39372 m[IU]/mL — ABNORMAL HIGH (ref <5)

## 2019-06-30 LAB — CBC
HCT: 42.8 % (ref 36.0–46.0)
Hemoglobin: 14 g/dL (ref 12.0–15.0)
MCH: 29.2 pg (ref 26.0–34.0)
MCHC: 32.7 g/dL (ref 30.0–36.0)
MCV: 89.4 fL (ref 80.0–100.0)
Platelets: 329 10*3/uL (ref 150–400)
RBC: 4.79 MIL/uL (ref 3.87–5.11)
RDW: 12 % (ref 11.5–15.5)
WBC: 7.2 10*3/uL (ref 4.0–10.5)
nRBC: 0 % (ref 0.0–0.2)

## 2019-06-30 LAB — POCT PREGNANCY, URINE: Preg Test, Ur: POSITIVE — AB

## 2019-06-30 MED ORDER — DOXYLAMINE-PYRIDOXINE 10-10 MG PO TBEC: 1.0000 | DELAYED_RELEASE_TABLET | Freq: Three times a day (TID) | ORAL | 3 refills | Status: DC

## 2019-06-30 NOTE — Discharge Instructions (Signed)
Nausea typically begins about 5 to 6 weeks and peaks about 11 weeks, resolving by 14 weeks for about half of women who experience it and by 22 weeks for 90 percent. Persistent or severe nausea beyond the first trimester should be further evaluated. One or all or any combination of these comfort measures can be tried to find the most effective relief for you:  Eat small, frequent meals, even as often as every 2 hours, because nausea is more common on an empty or overly full stomach. The more often you eat, the more chances you have to keep at least a little something down. Eat what sounds good to you and try cold foods if smells bother you. Eat a protein snack at bedtime and keep protein snacks by the bed to eat each time you awaken during the night to keep blood sugar stable and help prevent morning nausea. Eat dry crackers, potato chips, lemon drops, ginger cookies or toast before getting up in the morning. Make sure each meal or snack contains a source of protein to keep blood sugar stable. Do not brush your teeth immediately after getting up in the morning or right after eating to avoid stimulating the gag reflex at these susceptible times. Drink carbonated beverages, especially ginger ale that contains real ginger (like [place] Dry). Try keeping an unopened can by the bed to drink warm before getting up in the morning. Suck on lemon drops or sip lemonade throughout the day or when nauseated. Avoid food with strong or offensive flavors or slimy and overly chewy textures. Limit fat in your diet as it is hard to digest. Try acupressure wrist bands, like Sea-Bands, at P6 acupressure point per package instructions - available at pharmacies, [employer], [employer], etc. Rest! Nap daily or at least lie down whenever possible. Stop prenatal vitamins until nausea resolves and just take folic acid 400mg  by mouth daily. Ginger capsules 250mg  by mouth 4 times a day or 8 ounces of ginger tea 4 times a day. For mild  nausea without vomiting, Vitamin B6 (pyridoxine) 25mg  by mouth 3 times a day. For moderate nausea with mild vomiting (? 2 times a day) or if no relief from Vitamin B6 alone, ADD Unisom (doxylamine) 12.5mg  (1/2 tablet) by mouth at bedtime and continue Vitamin B6 (pyridoxine) 25mg  by mouth 3 times a day. If no relief after 4-5 days, try Unisom (doxylamine) 25mg  by mouth at bedtime and 12.5mg  (1/2 tablet) in the morning and in the midafternoon PLUS Vitamin B6 (pyridoxine) 25mg  three times a day. May also increase Vitamin B6 to 50mg  at bedtime with 25mg  in morning and midafternoon if needed. Unisom can cause drowsiness. Start with bedtime dose for 4-5 days first to decrease drowsiness then add as needed and as tolerated in morning and afternoon. NOTE: Bedtime dose helps with morning nausea, morning dose helps with afternoon nausea, and afternoon dose helps with evening nausea so adjust times for your particular needs.

## 2019-06-30 NOTE — MAU Note (Signed)
.   [facility] Judie Petit New Alexandra Spencer is a 24 y.o. at [redacted]w[redacted]d here in MAU reporting: she took a HPT last night and it was positive. Pt states that she had her IUD taken out in Oct 2020. Lower abdominal cramping with nausea LMP: 05/09/19 Onset of complaint: ongoing Pain score: 5 Vitals:   06/30/19 1113  BP: 114/66  Pulse: 84  Resp: 16  Temp: 98.2 F (36.8 C)  SpO2: 100%     FHT: Lab orders placed from triage: UA/UPT

## 2019-06-30 NOTE — MAU Provider Note (Addendum)
History   Alexandra Spencer is a 24 yo G2P1001 here after a positive home pregnancy test last night. She is "just here for a proof of pregnancy for Medicaid." She had an IUD removed in October. She endorses nausea, heartburn, and vomiting, but denies any abdominal pain, diarrhea, constipation, vaginal discharge, or vaginal bleeding. She is vomiting approximately six times per day, but is able to maintain PO food/fluid intake. She describes having similar symptoms in her first pregnancy that were controlled with Diclegis.    CSN: [id number]  Arrival date and time: 06/30/19 1053   None     No chief complaint on file.  OB History     Gravida  2   Para  1   Term  1   Preterm      AB      Living  1      SAB      TAB      Ectopic      Multiple  0   Live Births  1           Past Medical History:  Diagnosis Date  . Chlamydia   . Kidney stone   . Medical history non-contributory     Past Surgical History:  Procedure Laterality Date  . NO PAST SURGERIES      Family History  Problem Relation Age of Onset  . Breast cancer Mother   . Breast cancer Maternal Grandmother     Social History   Tobacco Use  . Smoking status: Never Smoker  . Smokeless tobacco: Never Used  Substance Use Topics  . Alcohol use: No  . Drug use: No    Allergies: No Known Allergies  Medications Prior to Admission  Medication Sig Dispense Refill Last Dose  . norgestimate-ethinyl estradiol (SPRINTEC 28) 0.25-35 MG-MCG tablet Take 1 tablet by mouth daily. (Patient not taking: Reported on 12/15/2018) 1 Package 2   . ondansetron (ZOFRAN) 4 MG tablet Take 1 tablet (4 mg total) by mouth daily as needed for nausea or vomiting. (Patient not taking: Reported on 12/15/2018) 14 tablet 0  at not taking  . predniSONE (DELTASONE) 20 MG tablet Take 2 tablets (40 mg total) by mouth daily. (Patient not taking: Reported on 12/15/2018) 10 tablet 0  at not taking    Review of Systems  Constitutional: Negative  for activity change and appetite change.  HENT: Negative.   Eyes: Negative.   Respiratory: Negative for shortness of breath.   Cardiovascular: Negative for chest pain and leg swelling.  Gastrointestinal: Positive for nausea and vomiting. Negative for abdominal pain, constipation and diarrhea.  Endocrine: Negative.   Genitourinary: Negative for vaginal bleeding, vaginal discharge and vaginal pain.  Musculoskeletal: Negative.   Allergic/Immunologic: Negative.   Neurological: Negative.   Hematological: Negative.   Psychiatric/Behavioral: Negative.    Physical Exam   Blood pressure 121/72, pulse 77, temperature 98.2 F (36.8 C), resp. rate 16, height 5\' 3"  (1.6 m), weight 75.8 kg, last menstrual period 05/09/2019, SpO2 100 %, unknown if currently breastfeeding.  Physical Exam  Constitutional: She appears well-developed and well-nourished. No distress.  GI: Soft. She exhibits no distension and no mass. There is no abdominal tenderness. There is no rebound and no guarding.  Genitourinary:    Genitourinary Comments: Deferred. No acute pelvic complaints today.    Skin: She is not diaphoretic.    MAU Course   MDM Lab studies: UPreg, UA, CBC, CMet, ABO/Rh, hCG quant   TVUS  Assessment and Plan  Confirmed intrauterine pregnancy Pregnancy confirmed by TVUS Plan:  Close f/u with [facility] Diclegis for nausea  Heartburn Likely 2/2 vomiting Plan:  Encourage PO Pepcid prn   Dorothyann Gibbs 06/30/2019, 1:29 PM    CNM attestation:  I have seen and examined this patient and agree with above documentation in the medical s note.   [facility] Alexandra Spencer is a 24 y.o. G2P1001 at [redacted]w[redacted]d reporting nausea and reporting abdominal pain earlier yesterday. VB, contractions, vaginal discharge.  PE: Patient Vitals for the past 24 hrs:  BP Temp Pulse Resp SpO2 Height Weight  06/30/19 1350 117/86 -- 83 -- -- -- --  06/30/19 1135 121/72 -- 77 -- -- -- --  06/30/19 1113 114/66 98.2 F (36.8 C)  84 16 100 % 5\' 3"  (1.6 m) 167 lb (75.8 kg)   Gen: calm comfortable, NAD Resp: normal effort, no distress Heart: Regular rate Abd: Soft, NT, gravid, S=D Bimanual exam deferred.   ROS, labs, PMH reviewed  Orders Placed This Encounter  Procedures  . OB Transvaginal  . US OB Comp Less 14 Wks  . Urinalysis, Routine w reflex microscopic  . CBC  . Comprehensive metabolic panel  . hCG, quantitative, pregnancy  . Pregnancy, urine POC  . ABO/Rh  . Discharge patient Discharge disposition: 01-Home or Self Care; Discharge patient date: 06/30/2019  . Discharge patient Discharge disposition: 01-Home or Self Care; Discharge patient date: 06/30/2019   Meds ordered this encounter  Medications  . Doxylamine-Pyridoxine (DICLEGIS) 10-10 MG TBEC    Sig: Take 1 tablet by mouth 3 (three) times daily.    Dispense:  60 tablet    Refill:  3    Order Specific Question:   Supervising Provider    Answer:   08/30/2019    MDM -Samara Snide shows that she is 6 weeks; EDC changed to reflect that as patient is not certain about her LMP.  -bimanual pelvic exam and vaginal cultures deferred as patient now denying any complaints and states that she "just wanted the pregnancy confirmation letter" so she answered "yes to the questions about abdominal pain".   Assessment: 1. Viable pregnancy in first trimester   2. Abdominal pain     Plan: - Discharge home in stable condition and follow-up as scheduled at your doctor's office for next prenatal visit or sooner as needed if symptoms worsen. - Information on how to use Diclegis and Unisom in replacement for  Diclegis if Diclegis is unaffordable. Pregnancy confirmation letter given.  - Return to maternity admissions symptoms worsen  [place], [facility] 06/30/2019 2:36 PM

## 2019-07-01 LAB — ABO/RH: ABO/RH(D): A POS

## 2019-07-28 LAB — OB RESULTS CONSOLE GC/CHLAMYDIA
Chlamydia: NEGATIVE
Gonorrhea: POSITIVE

## 2019-07-28 LAB — OB RESULTS CONSOLE HIV ANTIBODY (ROUTINE TESTING): HIV: NONREACTIVE

## 2019-07-28 LAB — OB RESULTS CONSOLE RPR: RPR: NONREACTIVE

## 2019-07-28 LAB — OB RESULTS CONSOLE HEPATITIS B SURFACE ANTIGEN: Hepatitis B Surface Ag: NEGATIVE

## 2019-07-28 LAB — OB RESULTS CONSOLE RUBELLA ANTIBODY, IGM: Rubella: IMMUNE

## 2019-07-28 LAB — OB RESULTS CONSOLE VARICELLA ZOSTER ANTIBODY, IGG: Varicella: IMMUNE

## 2019-08-31 ENCOUNTER — Encounter (HOSPITAL_COMMUNITY): Payer: Self-pay | Admitting: Emergency Medicine

## 2019-08-31 ENCOUNTER — Other Ambulatory Visit: Payer: Self-pay

## 2019-08-31 ENCOUNTER — Emergency Department (HOSPITAL_COMMUNITY)
Admission: EM | Admit: 2019-08-31 | Discharge: 2019-08-31 | Disposition: A | Discharge status: Home / Self Care | Payer: Medicaid Other | Attending: Emergency Medicine | Admitting: Emergency Medicine

## 2019-08-31 DIAGNOSIS — Z20822 Contact with and (suspected) exposure to covid-19: Secondary | ICD-10-CM | POA: Insufficient documentation

## 2019-08-31 DIAGNOSIS — R109 Unspecified abdominal pain: Secondary | ICD-10-CM | POA: Diagnosis not present

## 2019-08-31 DIAGNOSIS — Z3A16 16 weeks gestation of pregnancy: Secondary | ICD-10-CM | POA: Diagnosis not present

## 2019-08-31 DIAGNOSIS — O219 Vomiting of pregnancy, unspecified: Secondary | ICD-10-CM | POA: Insufficient documentation

## 2019-08-31 DIAGNOSIS — Z5321 Procedure and treatment not carried out due to patient leaving prior to being seen by health care provider: Secondary | ICD-10-CM | POA: Insufficient documentation

## 2019-08-31 DIAGNOSIS — R509 Fever, unspecified: Secondary | ICD-10-CM | POA: Diagnosis not present

## 2019-08-31 DIAGNOSIS — O99891 Other specified diseases and conditions complicating pregnancy: Secondary | ICD-10-CM | POA: Insufficient documentation

## 2019-08-31 LAB — I-STAT BETA HCG BLOOD, ED (MC, WL, AP ONLY): I-stat hCG, quantitative: >2000 m[IU]/mL — ABNORMAL HIGH (ref <5)

## 2019-08-31 LAB — SARS CORONAVIRUS 2 BY RT PCR (HOSPITAL ORDER, PERFORMED IN ~~LOC~~ HOSPITAL LAB): SARS Coronavirus 2: NEGATIVE

## 2019-08-31 NOTE — ED Triage Notes (Signed)
Pt c/o fever, cough, nausea, vomiting and abdominal pain that started last night. Pt is [redacted] weeks pregnant.

## 2019-08-31 NOTE — ED Notes (Signed)
Pt did no want to want and left.

## 2019-08-31 NOTE — ED Provider Notes (Cosign Needed)
I was asked to perform an MSE on this patient by triage nurse.  Patient is a G2 P1 who is currently [redacted] weeks pregnant presenting complaining of fever cough and shortness of breath.  Patient developed fever as high as 100.4 at home, having dry cough as well as pleuritic right-sided chest pain that developed since last night.  No abdominal pain, vaginal bleeding or vaginal discharge.  She has not had her Covid vaccination yet.  Chest x-ray and Covid screening test ordered.  She will need to be evaluated further.   Fayrene Helper, PA-C 08/31/19 1844

## 2019-09-01 ENCOUNTER — Encounter (HOSPITAL_BASED_OUTPATIENT_CLINIC_OR_DEPARTMENT_OTHER): Payer: Self-pay | Admitting: *Deleted

## 2019-09-01 ENCOUNTER — Emergency Department (HOSPITAL_BASED_OUTPATIENT_CLINIC_OR_DEPARTMENT_OTHER)
Admission: EM | Admit: 2019-09-01 | Discharge: 2019-09-01 | Disposition: A | Discharge status: Home / Self Care | Payer: Medicaid Other | Attending: Emergency Medicine | Admitting: Emergency Medicine

## 2019-09-01 ENCOUNTER — Emergency Department (HOSPITAL_BASED_OUTPATIENT_CLINIC_OR_DEPARTMENT_OTHER): Payer: Medicaid Other

## 2019-09-01 DIAGNOSIS — J069 Acute upper respiratory infection, unspecified: Secondary | ICD-10-CM

## 2019-09-01 DIAGNOSIS — O99512 Diseases of the respiratory system complicating pregnancy, second trimester: Secondary | ICD-10-CM | POA: Insufficient documentation

## 2019-09-01 DIAGNOSIS — Z3A16 16 weeks gestation of pregnancy: Secondary | ICD-10-CM | POA: Insufficient documentation

## 2019-09-01 MED ORDER — FLUTICASONE PROPIONATE 50 MCG/ACT NA SUSP: 2.0000 | Freq: Every day | NASAL | 0 refills | Status: AC

## 2019-09-01 MED ORDER — ALBUTEROL SULFATE HFA 108 (90 BASE) MCG/ACT IN AERS: 1.0000 | INHALATION_SPRAY | Freq: Four times a day (QID) | RESPIRATORY_TRACT | 0 refills | Status: AC | PRN

## 2019-09-01 MED ORDER — ONDANSETRON 4 MG PO TBDP: 4.0000 mg | ORAL_TABLET | Freq: Three times a day (TID) | ORAL | 0 refills | Status: AC | PRN

## 2019-09-01 NOTE — Discharge Instructions (Signed)
You were seen in the emergency department today with cough and vomiting.  I have called in several prescriptions to help with your symptoms.  Your x-ray did not show any bacterial pneumonia that would require antibiotics here.  Your Covid test from yesterday was negative.  Please call your OB/GYN for close follow-up appointment next week and return to the emergency department if you develop any new or suddenly worsening symptoms such as worsening chest pain, shortness of breath, high fevers not controlled with Tylenol.

## 2019-09-01 NOTE — ED Provider Notes (Signed)
Emergency Department Provider Note   I have reviewed the triage vital signs and the nursing notes.   HISTORY  Chief Complaint Cough   HPI Taiwana SHERMIKA BALTHASER is a 24 y.o. female with past medical history reviewed below, currently [redacted] weeks pregnant (G2P1), presents to the emergency department with bilateral chest tightness with frequent coughing.  Patient states she has had 2 days of cough, runny nose, fever yesterday.  She describes bilateral chest wall discomfort worse with coughing.  She has such frequent coughing that she is having difficulty with keeping down food and water over the past 2 days.  She has associated diarrhea.  She was seen in the emergency department yesterday but left prior to being seen.  She had a Covid test at that time which was negative. No other labs or imaging at that time. No radiation of symptoms or modifying factors. Vomiting is post-tussive in nature.   Past Medical History:  Diagnosis Date  . Chlamydia   . Kidney stone   . Medical history non-contributory     There are no problems to display for this patient.   Past Surgical History:  Procedure Laterality Date  . NO PAST SURGERIES      Allergies Patient has no known allergies.  Family History  Problem Relation Age of Onset  . Breast cancer Mother   . Breast cancer Maternal Grandmother     Social History Social History   Tobacco Use  . Smoking status: Never Smoker  . Smokeless tobacco: Never Used  Substance Use Topics  . Alcohol use: No  . Drug use: No    Review of Systems  Constitutional: Positive fever.  Eyes: No visual changes. ENT: No sore throat. Cardiovascular: Positive chest pain. Respiratory: Denies shortness of breath. Positive frequent coughing.  Gastrointestinal: No abdominal pain. Positive nausea and vomiting. Positive diarrhea.  No constipation. Genitourinary: Negative for dysuria. No vaginal bleeding or discharge.  Musculoskeletal: Negative for back pain. Skin:  Negative for rash. Neurological: Negative for headaches, focal weakness or numbness.  10-point ROS otherwise negative.  ____________________________________________   PHYSICAL EXAM:  VITAL SIGNS: ED Triage Vitals  Enc Vitals Group     BP 09/01/19 1518 112/68     Pulse Rate 09/01/19 1518 (!) 106     Resp 09/01/19 1518 18     Temp 09/01/19 1518 98.8 F (37.1 C)     Temp Source 09/01/19 1518 Oral     SpO2 09/01/19 1518 98 %     Weight 09/01/19 1516 165 lb (74.8 kg)     Height 09/01/19 1516 5\' 3"  (1.6 m)   Constitutional: Alert and oriented. Well appearing and in no acute distress. Frequent coughing.  Eyes: Conjunctivae are normal.  Head: Atraumatic. Nose: No congestion/rhinnorhea. Mouth/Throat: Mucous membranes are moist.  Neck: No stridor.   Cardiovascular: Normal rate, regular rhythm. Good peripheral circulation. Grossly normal heart sounds.   Respiratory: Normal respiratory effort.  No retractions. Lungs CTAB. Gastrointestinal: Soft and nontender. No distention.  Musculoskeletal: No lower extremity tenderness nor edema. No gross deformities of extremities. Neurologic:  Normal speech and language. No gross focal neurologic deficits are appreciated.  Skin:  Skin is warm, dry and intact. No rash noted.   ____________________________________________  RADIOLOGY  DG Chest 2 View  Result Date: 09/01/2019 CLINICAL DATA:  Cough for 2 days, congestion, [redacted] weeks pregnant EXAM: CHEST - 2 VIEW COMPARISON:  08/29/2016 FINDINGS: The heart size and mediastinal contours are within normal limits. Both lungs are clear. The  visualized skeletal structures are unremarkable. IMPRESSION: No active cardiopulmonary disease. Electronically Signed   By: Randa Ngo M.D.   On: 09/01/2019 16:10    ____________________________________________   PROCEDURES  Procedure(s) performed:   Procedures  None  ____________________________________________   INITIAL IMPRESSION / ASSESSMENT AND PLAN  / ED COURSE  Pertinent labs & imaging results that were available during my care of the patient were reviewed by me and considered in my medical decision making (see chart for details).   Patient presents emergency department with cough, subjective fever, congestion.  Covid test negative in the ED yesterday.  Lungs are clear with normal oxygen saturation.  Chest discomfort is diffuse, bilateral, worse with coughing.  Have exceedingly low suspicion for PE.  Plan for chest x-ray and likely symptom management at home. Mild tachycardia here improved on my bedside exam without intervention.   Patient with normal CXR without infiltrate. Plan for Flonase, Albuterol, and Zofran for symptoms. Discussed plan for Gyn follow up next week and discussed strict ED return precautions.  ____________________________________________  FINAL CLINICAL IMPRESSION(S) / ED DIAGNOSES  Final diagnoses:  Viral URI with cough    NEW OUTPATIENT MEDICATIONS STARTED DURING THIS VISIT:  New Prescriptions   ALBUTEROL (VENTOLIN HFA) 108 (90 BASE) MCG/ACT INHALER    Inhale 1-2 puffs into the lungs every 6 (six) hours as needed for wheezing or shortness of breath.   FLUTICASONE (FLONASE) 50 MCG/ACT NASAL SPRAY    Place 2 sprays into both nostrils daily for 10 days.   ONDANSETRON (ZOFRAN ODT) 4 MG DISINTEGRATING TABLET    Take 1 tablet (4 mg total) by mouth every 8 (eight) hours as needed.    Note:  This document was prepared using Dragon voice recognition software and may include unintentional dictation errors.  Nanda Quinton, MD, [facility] Emergency Medicine    Mignonne Afonso, [facility], MD 09/01/19 [phone]

## 2019-09-01 NOTE — ED Triage Notes (Signed)
Pt reports cough x 2 days with subjective temps and congestion. Seen at [facility] last night with neg covid test, did not wait to be seen, "the wait was too long." pt c/o rib pain when she coughs.

## 2019-09-09 LAB — OB RESULTS CONSOLE GC/CHLAMYDIA
Chlamydia: NEGATIVE
Gonorrhea: NEGATIVE

## 2019-12-04 ENCOUNTER — Inpatient Hospital Stay (HOSPITAL_COMMUNITY): Payer: Medicaid Other

## 2019-12-04 ENCOUNTER — Inpatient Hospital Stay (HOSPITAL_COMMUNITY)
Admission: AD | Admit: 2019-12-04 | Discharge: 2019-12-04 | Disposition: A | Discharge status: Home / Self Care | Payer: Medicaid Other | Attending: Obstetrics & Gynecology | Admitting: Obstetrics & Gynecology

## 2019-12-04 ENCOUNTER — Other Ambulatory Visit: Payer: Self-pay

## 2019-12-04 ENCOUNTER — Encounter (HOSPITAL_COMMUNITY): Payer: Self-pay | Admitting: Obstetrics & Gynecology

## 2019-12-04 DIAGNOSIS — Z79899 Other long term (current) drug therapy: Secondary | ICD-10-CM | POA: Diagnosis not present

## 2019-12-04 DIAGNOSIS — O99513 Diseases of the respiratory system complicating pregnancy, third trimester: Secondary | ICD-10-CM

## 2019-12-04 DIAGNOSIS — R112 Nausea with vomiting, unspecified: Secondary | ICD-10-CM | POA: Diagnosis not present

## 2019-12-04 DIAGNOSIS — U071 COVID-19: Secondary | ICD-10-CM | POA: Diagnosis not present

## 2019-12-04 DIAGNOSIS — R519 Headache, unspecified: Secondary | ICD-10-CM

## 2019-12-04 DIAGNOSIS — O219 Vomiting of pregnancy, unspecified: Secondary | ICD-10-CM

## 2019-12-04 DIAGNOSIS — J205 Acute bronchitis due to respiratory syncytial virus: Secondary | ICD-10-CM | POA: Diagnosis not present

## 2019-12-04 DIAGNOSIS — O26893 Other specified pregnancy related conditions, third trimester: Secondary | ICD-10-CM | POA: Insufficient documentation

## 2019-12-04 DIAGNOSIS — O98513 Other viral diseases complicating pregnancy, third trimester: Secondary | ICD-10-CM | POA: Diagnosis not present

## 2019-12-04 DIAGNOSIS — Z3A28 28 weeks gestation of pregnancy: Secondary | ICD-10-CM | POA: Insufficient documentation

## 2019-12-04 DIAGNOSIS — O212 Late vomiting of pregnancy: Secondary | ICD-10-CM | POA: Diagnosis not present

## 2019-12-04 LAB — URINALYSIS, ROUTINE W REFLEX MICROSCOPIC
Bacteria, UA: NONE SEEN
Bilirubin Urine: NEGATIVE
Glucose, UA: NEGATIVE mg/dL
Hgb urine dipstick: NEGATIVE
Ketones, ur: NEGATIVE mg/dL
Nitrite: NEGATIVE
Protein, ur: NEGATIVE mg/dL
Specific Gravity, Urine: 1.018 (ref 1.005–1.030)
pH: 6 (ref 5.0–8.0)

## 2019-12-04 LAB — SARS CORONAVIRUS 2 BY RT PCR (HOSPITAL ORDER, PERFORMED IN ~~LOC~~ HOSPITAL LAB): SARS Coronavirus 2: POSITIVE — AB

## 2019-12-04 MED ORDER — BENZONATATE 100 MG PO CAPS
100.0000 mg | ORAL_CAPSULE | Freq: Three times a day (TID) | ORAL | 0 refills | Status: DC
Start: 2019-12-04 — End: 2020-02-20

## 2019-12-04 MED ORDER — BUTALBITAL-APAP-CAFFEINE 50-325-40 MG PO TABS
2.0000 | ORAL_TABLET | Freq: Once | ORAL | Status: AC
  Administered 2019-12-04: 2 via ORAL
  Filled 2019-12-04: qty 2

## 2019-12-04 MED ORDER — DIPHENHYDRAMINE HCL 50 MG/ML IJ SOLN
25.0000 mg | Freq: Once | INTRAMUSCULAR | Status: AC
  Administered 2019-12-04: 25 mg via INTRAVENOUS
  Filled 2019-12-04: qty 1

## 2019-12-04 MED ORDER — METOCLOPRAMIDE HCL 10 MG PO TABS: 10.0000 mg | ORAL_TABLET | Freq: Four times a day (QID) | ORAL | 0 refills | Status: AC

## 2019-12-04 MED ORDER — LACTATED RINGERS IV BOLUS (SEPSIS)
1000.0000 mL | Freq: Once | INTRAVENOUS | Status: AC
  Administered 2019-12-04: 1000 mL via INTRAVENOUS

## 2019-12-04 MED ORDER — DEXAMETHASONE SODIUM PHOSPHATE 10 MG/ML IJ SOLN
10.0000 mg | Freq: Once | INTRAMUSCULAR | Status: AC
  Administered 2019-12-04: 10 mg via INTRAVENOUS
  Filled 2019-12-04: qty 1

## 2019-12-04 MED ORDER — METOCLOPRAMIDE HCL 5 MG/ML IJ SOLN
10.0000 mg | Freq: Once | INTRAMUSCULAR | Status: AC
  Administered 2019-12-04: 10 mg via INTRAVENOUS
  Filled 2019-12-04: qty 2

## 2019-12-04 MED ORDER — GUAIFENESIN ER 600 MG PO TB12: 1200.0000 mg | ORAL_TABLET | Freq: Two times a day (BID) | ORAL | 0 refills | Status: AC | PRN

## 2019-12-04 NOTE — MAU Provider Note (Signed)
Chief Complaint:  Headache   First Provider Initiated Contact with Patient 12/04/19 1625      HPI: [facility] Alexandra Spencer is a 24 y.o. G2P1001 at [redacted]w[redacted]d who presents to maternity admissions reporting n/v x 2 weeks, severe frontal headache x 3 days, and  cough x 3+ weeks. She also reports pain in her sides and upper abdomen with coughing.  She reports her daughter had RSV 5 weeks ago and she developed URI symptoms with cough and congestion.  Her cough has persisted 3+ weeks but is gradually improving now.  She has seen PCP multiple times and had COVID testing but tests have been negative.  She reports n/v in the last 2 weeks and has not been able to keep much down. She developed a severe h/a 3 days ago that is worsening, is most severe with coughing, and is the main reason she came to MAU.   She reports good fetal movement and denies any cramping/contractions.    Location: head and bilateral flank/upper abdomen Quality: dull/pressure in head, sharp pain with cough in flanks Severity: 10/10 on pain scale for h/a Duration: h/a x 3 days, pain in sides x 2-3 days Timing: h/a constant, waxes and wanes, flank pain intermittent with coughing Modifying factors: Pt took Tylenol yesterday but it did not help, has not taken any medications for cough Associated signs and symptoms: n/v, cough  HPI  Past Medical History: Past Medical History:  Diagnosis Date  . Chlamydia   . Kidney stone   . Medical history non-contributory     Past obstetric history: OB History  Gravida Para Term Preterm AB Living  2 1 1     1   SAB TAB Ectopic Multiple Live Births        0 1    # Outcome Date GA Lbr Len/2nd Weight Sex Delivery Anes PTL Lv  2 Current           1 Term 08/14/14 [redacted]w[redacted]d 08:17 / 02:01 3226 g F Vag-Spont EPI  LIV    Past Surgical History: Past Surgical History:  Procedure Laterality Date  . NO PAST SURGERIES      Family History: Family History  Problem Relation Age of Onset  . Breast cancer Mother    . Breast cancer Maternal Grandmother     Social History: Social History   Tobacco Use  . Smoking status: Never Smoker  . Smokeless tobacco: Never Used  Substance Use Topics  . Alcohol use: No  . Drug use: No    Allergies: No Known Allergies  Meds:  Medications Prior to Admission  Medication Sig Dispense Refill Last Dose  . albuterol (VENTOLIN HFA) 108 (90 Base) MCG/ACT inhaler Inhale 1-2 puffs into the lungs every 6 (six) hours as needed for wheezing or shortness of breath. 6.7 g 0   . fluticasone (FLONASE) 50 MCG/ACT nasal spray Place 2 sprays into both nostrils daily for 10 days. 11.1 mL 0   . ondansetron (ZOFRAN ODT) 4 MG disintegrating tablet Take 1 tablet (4 mg total) by mouth every 8 (eight) hours as needed. 20 tablet 0     ROS:  Review of Systems  Constitutional: Negative for chills, fatigue and fever.  HENT: Positive for congestion. Negative for ear pain, rhinorrhea and sore throat.   Eyes: Negative for visual disturbance.  Respiratory: Positive for cough. Negative for shortness of breath.   Cardiovascular: Negative for chest pain.  Gastrointestinal: Negative for abdominal pain, nausea and vomiting.  Genitourinary: Positive for flank pain.  Negative for difficulty urinating, dysuria, pelvic pain, vaginal bleeding, vaginal discharge and vaginal pain.  Neurological: Positive for headaches. Negative for dizziness.  Psychiatric/Behavioral: Negative.      I have reviewed patient's Past Medical Hx, Surgical Hx, Family Hx, Social Hx, medications and allergies.   Physical Exam   Patient Vitals for the past 24 hrs:  BP Temp Temp src Pulse Resp  12/04/19 1541 (!) 114/59 98.6 F (37 C) Oral 97 16   Constitutional: Well-developed, well-nourished female in no acute distress.  Cardiovascular: normal rate Respiratory: normal effort GI: Abd soft, non-tender, gravid appropriate for gestational age.  MS: Extremities nontender, no edema, normal ROM Neurologic: Alert and  oriented x 4.  GU: Neg CVAT.  PELVIC EXAM: Cervix pink, visually closed, without lesion, scant white creamy discharge, vaginal walls and external genitalia normal Bimanual exam: Cervix 0/long/high, firm, anterior, neg CMT, uterus nontender, nonenlarged, adnexa without tenderness, enlargement, or mass     FHT:  Baseline 150 , moderate variability, accelerations present, no decelerations Contractions: None on toco or to palpation   Labs: Results for orders placed or performed during the hospital encounter of 12/04/19 (from the past 24 hour(s))  Urinalysis, Routine w reflex microscopic Urine, Clean Catch     Status: Abnormal   Collection Time: 12/04/19  3:10 PM  Result Value Ref Range   Color, Urine YELLOW YELLOW   APPearance CLEAR CLEAR   Specific Gravity, Urine 1.018 1.005 - 1.030   pH 6.0 5.0 - 8.0   Glucose, UA NEGATIVE NEGATIVE mg/dL   Hgb urine dipstick NEGATIVE NEGATIVE   Bilirubin Urine NEGATIVE NEGATIVE   Ketones, ur NEGATIVE NEGATIVE mg/dL   Protein, ur NEGATIVE NEGATIVE mg/dL   Nitrite NEGATIVE NEGATIVE   Leukocytes,Ua MODERATE (A) NEGATIVE   RBC / HPF 0-5 0 - 5 RBC/hpf   WBC, UA 0-5 0 - 5 WBC/hpf   Bacteria, UA NONE SEEN NONE SEEN   Squamous Epithelial / LPF 0-5 0 - 5   Mucus PRESENT   SARS Coronavirus 2 by RT PCR (hospital order, performed in [facility] hospital lab) Nasopharyngeal Nasopharyngeal Swab     Status: Abnormal   Collection Time: 12/04/19  5:36 PM   Specimen: Nasopharyngeal Swab  Result Value Ref Range   SARS Coronavirus 2 POSITIVE (A) NEGATIVE   --/--/A POS (04/08 1157)  Imaging:  DG Chest Portable 1 View  Result Date: 12/04/2019 CLINICAL DATA:  Cough, flank and chest pain with headache EXAM: PORTABLE CHEST 1 VIEW COMPARISON:  09/01/2019 FINDINGS: Trachea midline. Cardiomediastinal contours and hilar structures are normal. Lungs are clear. No effusion. On limited assessment no acute skeletal process. IMPRESSION: No acute cardiopulmonary disease.  Electronically Signed   By: Donzetta Kohut M.D.   On: 12/04/2019 17:25    MAU Course/MDM: Orders Placed This Encounter  Procedures  . SARS Coronavirus 2 by RT PCR (hospital order, performed in [facility] hospital lab) Nasopharyngeal Nasopharyngeal Swab  . DG Chest Portable 1 View  . Urinalysis, Routine w reflex microscopic Urine, Clean Catch  . Discharge patient    Meds ordered this encounter  Medications  . FOLLOWED BY Linked Order Group   . lactated ringers bolus 1,000 mL   . diphenhydrAMINE (BENADRYL) injection 25 mg   . metoCLOPramide (REGLAN) injection 10 mg   . dexamethasone (DECADRON) injection 10 mg  . butalbital-acetaminophen-caffeine (FIORICET) 50-325-40 MG per tablet 2 tablet  . metoCLOPramide (REGLAN) 10 MG tablet    Sig: Take 1 tablet (10 mg total) by mouth every  6 (six) hours.    Dispense:  30 tablet    Refill:  0    Order Specific Question:   Supervising Provider    Answer:   Mariel Aloe A [[id number]]  . guaiFENesin (MUCINEX) 600 MG 12 hr tablet    Sig: Take 2 tablets (1,200 mg total) by mouth 2 (two) times daily as needed.    Dispense:  12 tablet    Refill:  0    Order Specific Question:   Supervising Provider    Answer:   Mariel Aloe A [[id number]]  . benzonatate (TESSALON) 100 MG capsule    Sig: Take 1 capsule (100 mg total) by mouth every 8 (eight) hours.    Dispense:  21 capsule    Refill:  0    Order Specific Question:   Supervising Provider    Answer:   Mariel Aloe A [[id number]]     NST reviewed and reactive Headache cocktail with IV LR, Decadron, Benadryl, and Reglan given, chest x-ray ordered COVID swab ordered CXR normal, pt cough improved, new onset headache in last 2 days COVID test is positive in MAU H/A and nausea improved after headache cocktail Given new onset h/a, and pt report that her daughter had new h/a last week, COVID is likely new infection, superimposed on resolving bronchitis/cough from past RSV Discussed monoclonal antibody IV  therapy with pt who expresses interest, as treatment is not yet available in MAU, message left with pt OB to set up outpatient therapy Return precautions for pregnancy and worsening COVID reviiewd Rx for Tessalon Perles, Mucinex, and Reglan    Assessment: 1. Headache in pregnancy, antepartum, third trimester   2. Acute bronchitis due to respiratory syncytial virus (RSV)   3. Nausea and vomiting during pregnancy prior to [redacted] weeks gestation   4. Lab test positive for detection of COVID-19 virus     Plan: Discharge home Labor precautions and fetal kick counts  Follow-up Information    Taam-Akelman, Griselda Miner, MD Follow up.   Specialty: Obstetrics and Gynecology Why: The office will contact you about outpatient treatment options like monoclonal antibody IV therapy for COVID positive in pregnancy.  Return to MAU for worsening COVID symptoms, or the ED for severe respiratory symptoms.  Contact information: [address] [place] [address] [phone]              Allergies as of 12/04/2019   No Known Allergies     Medication List    TAKE these medications   albuterol 108 (90 Base) MCG/ACT inhaler Commonly known as: VENTOLIN HFA Inhale 1-2 puffs into the lungs every 6 (six) hours as needed for wheezing or shortness of breath.   benzonatate 100 MG capsule Commonly known as: TESSALON Take 1 capsule (100 mg total) by mouth every 8 (eight) hours.   fluticasone 50 MCG/ACT nasal spray Commonly known as: FLONASE Place 2 sprays into both nostrils daily for 10 days.   guaiFENesin 600 MG 12 hr tablet Commonly known as: Mucinex Take 2 tablets (1,200 mg total) by mouth 2 (two) times daily as needed.   metoCLOPramide 10 MG tablet Commonly known as: REGLAN Take 1 tablet (10 mg total) by mouth every 6 (six) hours.   ondansetron 4 MG disintegrating tablet Commonly known as: Zofran ODT Take 1 tablet (4 mg total) by mouth every 8 (eight) hours as needed.        Sharen Counter Certified Nurse-Midwife 12/04/2019 8:06 PM

## 2019-12-04 NOTE — MAU Note (Signed)
Pt reports to mau with c/o headache for the past 3 days.  Pt reports congestion for the past month.  States her daughter had RSV about a month ago and that she has had symptoms since then.  Pt reports she has been covid tested several times over the past month and they have all been negative.  Last test was Friday. Pt also c/o some side pain bilaterally when she coughs.  Pt has not taken any pain meds today but tried tylenol yesterday.  Pt reports getting dizzy upon standing for the past 3 days.  Denies LOF and ctx at this time. +FM

## 2019-12-04 NOTE — Discharge Instructions (Signed)
COVID-19 COVID-19 is a respiratory infection that is caused by a virus called severe acute respiratory syndrome coronavirus 2 (SARS-CoV-2). The disease is also known as coronavirus disease or novel coronavirus. In some people, the virus may not cause any symptoms. In others, it may cause a serious infection. The infection can get worse quickly and can lead to complications, such as:  Pneumonia, or infection of the lungs.  Acute respiratory distress syndrome or ARDS. This is a condition in which fluid build-up in the lungs prevents the lungs from filling with air and passing oxygen into the blood.  Acute respiratory failure. This is a condition in which there is not enough oxygen passing from the lungs to the body or when carbon dioxide is not passing from the lungs out of the body.  Sepsis or septic shock. This is a serious bodily reaction to an infection.  Blood clotting problems.  Secondary infections due to bacteria or fungus.  Organ failure. This is when your body's organs stop working. The virus that causes COVID-19 is contagious. This means that it can spread from person to person through droplets from coughs and sneezes (respiratory secretions). What are the causes? This illness is caused by a virus. You may catch the virus by:  Breathing in droplets from an infected person. Droplets can be spread by a person breathing, speaking, singing, coughing, or sneezing.  Touching something, like a table or a doorknob, that was exposed to the virus (contaminated) and then touching your mouth, nose, or eyes. What increases the risk? Risk for infection You are more likely to be infected with this virus if you:  Are within 6 feet (2 meters) of a person with COVID-19.  Provide care for or live with a person who is infected with COVID-19.  Spend time in crowded indoor spaces or live in shared housing. Risk for serious illness You are more likely to become seriously ill from the virus if you:   Are 50 years of age or older. The higher your age, the more you are at risk for serious illness.  Live in a nursing home or long-term care facility.  Have cancer.  Have a long-term (chronic) disease such as: ? Chronic lung disease, including chronic obstructive pulmonary disease or asthma. ? A long-term disease that lowers your body's ability to fight infection (immunocompromised). ? Heart disease, including heart failure, a condition in which the arteries that lead to the heart become narrow or blocked (coronary artery disease), a disease which makes the heart muscle thick, weak, or stiff (cardiomyopathy). ? Diabetes. ? Chronic kidney disease. ? Sickle cell disease, a condition in which red blood cells have an abnormal "sickle" shape. ? Liver disease.  Are obese. What are the signs or symptoms? Symptoms of this condition can range from mild to severe. Symptoms may appear any time from 2 to 14 days after being exposed to the virus. They include:  A fever or chills.  A cough.  Difficulty breathing.  Headaches, body aches, or muscle aches.  Runny or stuffy (congested) nose.  A sore throat.  New loss of taste or smell. Some people may also have stomach problems, such as nausea, vomiting, or diarrhea. Other people may not have any symptoms of COVID-19. How is this diagnosed? This condition may be diagnosed based on:  Your signs and symptoms, especially if: ? You live in an area with a COVID-19 outbreak. ? You recently traveled to or from an area where the virus is common. ? You   provide care for or live with a person who was diagnosed with COVID-19. ? You were exposed to a person who was diagnosed with COVID-19.  A physical exam.  Lab tests, which may include: ? Taking a sample of fluid from the back of your nose and throat (nasopharyngeal fluid), your nose, or your throat using a swab. ? A sample of mucus from your lungs (sputum). ? Blood tests.  Imaging tests, which  may include, X-rays, CT scan, or ultrasound. How is this treated? At present, there is no medicine to treat COVID-19. Medicines that treat other diseases are being used on a trial basis to see if they are effective against COVID-19. Your health care provider will talk with you about ways to treat your symptoms. For most people, the infection is mild and can be managed at home with rest, fluids, and over-the-counter medicines. Treatment for a serious infection usually takes places in a hospital intensive care unit (ICU). It may include one or more of the following treatments. These treatments are given until your symptoms improve.  Receiving fluids and medicines through an IV.  Supplemental oxygen. Extra oxygen is given through a tube in the nose, a face mask, or a hood.  Positioning you to lie on your stomach (prone position). This makes it easier for oxygen to get into the lungs.  Continuous positive airway pressure (CPAP) or bi-level positive airway pressure (BPAP) machine. This treatment uses mild air pressure to keep the airways open. A tube that is connected to a motor delivers oxygen to the body.  Ventilator. This treatment moves air into and out of the lungs by using a tube that is placed in your windpipe.  Tracheostomy. This is a procedure to create a hole in the neck so that a breathing tube can be inserted.  Extracorporeal membrane oxygenation (ECMO). This procedure gives the lungs a chance to recover by taking over the functions of the heart and lungs. It supplies oxygen to the body and removes carbon dioxide. Follow these instructions at home: Lifestyle  If you are sick, stay home except to get medical care. Your health care provider will tell you how long to stay home. Call your health care provider before you go for medical care.  Rest at home as told by your health care provider.  Do not use any products that contain nicotine or tobacco, such as cigarettes, e-cigarettes, and  chewing tobacco. If you need help quitting, ask your health care provider.  Return to your normal activities as told by your health care provider. Ask your health care provider what activities are safe for you. General instructions  Take over-the-counter and prescription medicines only as told by your health care provider.  Drink enough fluid to keep your urine pale yellow.  Keep all follow-up visits as told by your health care provider. This is important. How is this prevented?  There is no vaccine to help prevent COVID-19 infection. However, there are steps you can take to protect yourself and others from this virus. To protect yourself:   Do not travel to areas where COVID-19 is a risk. The areas where COVID-19 is reported change often. To identify high-risk areas and travel restrictions, check the CDC travel website: wwwnc.cdc.gov/travel/notices  If you live in, or must travel to, an area where COVID-19 is a risk, take precautions to avoid infection. ? Stay away from people who are sick. ? Wash your hands often with soap and water for 20 seconds. If soap and water   are not available, use an alcohol-based hand sanitizer. ? Avoid touching your mouth, face, eyes, or nose. ? Avoid going out in public, follow guidance from your state and local health authorities. ? If you must go out in public, wear a cloth face covering or face mask. Make sure your mask covers your nose and mouth. ? Avoid crowded indoor spaces. Stay at least 6 feet (2 meters) away from others. ? Disinfect objects and surfaces that are frequently touched every day. This may include:  Counters and tables.  Doorknobs and light switches.  Sinks and faucets.  Electronics, such as phones, remote controls, keyboards, computers, and tablets. To protect others: If you have symptoms of COVID-19, take steps to prevent the virus from spreading to others.  If you think you have a COVID-19 infection, contact your health care  provider right away. Tell your health care team that you think you may have a COVID-19 infection.  Stay home. Leave your house only to seek medical care. Do not use public transport.  Do not travel while you are sick.  Wash your hands often with soap and water for 20 seconds. If soap and water are not available, use alcohol-based hand sanitizer.  Stay away from other members of your household. Let healthy household members care for children and pets, if possible. If you have to care for children or pets, wash your hands often and wear a mask. If possible, stay in your own room, separate from others. Use a different bathroom.  Make sure that all people in your household wash their hands well and often.  Cough or sneeze into a tissue or your sleeve or elbow. Do not cough or sneeze into your hand or into the air.  Wear a cloth face covering or face mask. Make sure your mask covers your nose and mouth. Where to find more information  Centers for Disease Control and Prevention: www.cdc.gov/coronavirus/2019-ncov/index.html  World Health Organization: www.who.int/health-topics/coronavirus Contact a health care provider if:  You live in or have traveled to an area where COVID-19 is a risk and you have symptoms of the infection.  You have had contact with someone who has COVID-19 and you have symptoms of the infection. Get help right away if:  You have trouble breathing.  You have pain or pressure in your chest.  You have confusion.  You have bluish lips and fingernails.  You have difficulty waking from sleep.  You have symptoms that get worse. These symptoms may represent a serious problem that is an emergency. Do not wait to see if the symptoms will go away. Get medical help right away. Call your local emergency services (911 in the [place] Do not drive yourself to the hospital. Let the emergency medical personnel know if you think you have COVID-19. Summary  COVID-19 is a  respiratory infection that is caused by a virus. It is also known as coronavirus disease or novel coronavirus. It can cause serious infections, such as pneumonia, acute respiratory distress syndrome, acute respiratory failure, or sepsis.  The virus that causes COVID-19 is contagious. This means that it can spread from person to person through droplets from breathing, speaking, singing, coughing, or sneezing.  You are more likely to develop a serious illness if you are 50 years of age or older, have a weak immune system, live in a nursing home, or have chronic disease.  There is no medicine to treat COVID-19. Your health care provider will talk with you about ways to treat your symptoms.    Take steps to protect yourself and others from infection. Wash your hands often and disinfect objects and surfaces that are frequently touched every day. Stay away from people who are sick and wear a mask if you are sick. This information is not intended to replace advice given to you by your health care provider. Make sure you discuss any questions you have with your health care provider. Document Revised: 01/07/2019 Document Reviewed: 04/15/2018 Elsevier Patient Education  2020 [employer]vier Inc.  

## 2019-12-05 ENCOUNTER — Telehealth: Payer: Self-pay | Admitting: Physician Assistant

## 2019-12-05 NOTE — Telephone Encounter (Signed)
Called to Discuss with patient about Covid symptoms and the use of the monoclonal antibody infusion for those with mild to moderate Covid symptoms and at a high risk of hospitalization.     Pt appears to qualify for this infusion due to co-morbid conditions and/or a member of an at-risk group in accordance with the FDA Emergency Use Authorization.    I spoke with the patient and she would like to contact her OB to discuss MAB infusion during pregnancy. She will call the hotline if she wants to schedule. I have also sent information via MyChart message. Symptoms started 9/6 - 11/29/19.   Roe Rutherford Akiko Schexnider, PA-C 12/05/2019, 11:33 AM

## 2020-01-16 ENCOUNTER — Other Ambulatory Visit (HOSPITAL_COMMUNITY): Payer: Self-pay | Admitting: Obstetrics and Gynecology

## 2020-01-16 DIAGNOSIS — R1011 Right upper quadrant pain: Secondary | ICD-10-CM

## 2020-01-20 ENCOUNTER — Ambulatory Visit (HOSPITAL_COMMUNITY)
Admission: RE | Admit: 2020-01-20 | Discharge: 2020-01-20 | Disposition: A | Discharge status: Home / Self Care | Payer: Medicaid Other | Source: Ambulatory Visit | Attending: Obstetrics and Gynecology | Admitting: Obstetrics and Gynecology

## 2020-01-20 DIAGNOSIS — R1011 Right upper quadrant pain: Secondary | ICD-10-CM | POA: Insufficient documentation

## 2020-01-30 LAB — OB RESULTS CONSOLE GC/CHLAMYDIA
Chlamydia: NEGATIVE
Gonorrhea: NEGATIVE

## 2020-01-30 LAB — OB RESULTS CONSOLE GBS: GBS: POSITIVE

## 2020-02-09 ENCOUNTER — Telehealth (HOSPITAL_COMMUNITY): Payer: Self-pay | Admitting: *Deleted

## 2020-02-09 ENCOUNTER — Encounter (HOSPITAL_COMMUNITY): Payer: Self-pay | Admitting: *Deleted

## 2020-02-09 NOTE — Telephone Encounter (Signed)
Preadmission screen  

## 2020-02-16 ENCOUNTER — Other Ambulatory Visit: Payer: Self-pay | Admitting: Obstetrics and Gynecology

## 2020-02-16 NOTE — H&P (Signed)
[facility] ELENORA HAWBAKER is a 24 y.o. female G2P1001 at 35 with N/V, elective at ter.  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  Gonorrhea at [facility] w/u, TOC neg.  Had placenta previa, resolved.  Pregnancy w GERD and asthme.  Pregnancy dated by 6 wk [place].  D/W pt r/b/a and process of IOL.  Received Tdap 9/8.    OB History    Gravida  2   Para  1   Term  1   Preterm      AB      Living  1     SAB      TAB      Ectopic      Multiple  0   Live Births  1         G1 female 7#1, SVD 5/16 G2 present  no abn pap + Chl, +GC  Past Medical History:  Diagnosis Date  . Chlamydia   . Kidney stone   . Medical history non-contributory    Past Surgical History:  Procedure Laterality Date  . NO PAST SURGERIES     Family History: family history includes Breast cancer in her maternal grandmother and mother; Cancer in her maternal grandfather and maternal uncle. Social History:  reports that she has never smoked. She has never used smokeless tobacco. She reports that she does not drink alcohol and does not use drugs.  Waitress, single  Meds Flonase, Protonix, Proventil All NKDA     Maternal Diabetes: No Genetic Screening: Normal Maternal Ultrasounds/Referrals: Normal Fetal Ultrasounds or other Referrals:  None Maternal Substance Abuse:  No Significant Maternal Medications:  None Significant Maternal Lab Results:  Group B Strep positive Other Comments:  None  Review of Systems  Constitutional: Negative.   HENT: Negative.   Eyes: Negative.   Respiratory: Negative.   Cardiovascular: Negative.   Gastrointestinal: Positive for abdominal pain and nausea.  Genitourinary: Negative.   Musculoskeletal: Positive for back pain.  Skin: Negative.   Neurological: Negative.   Psychiatric/Behavioral: Negative.    Maternal Medical History:  Reason for admission: Nausea.   Contractions: Frequency: irregular.    Fetal activity: Perceived fetal activity is normal.    Prenatal complications: N&V  Prenatal  Complications - Diabetes: none.      Last menstrual period 05/02/2019, unknown if currently breastfeeding. Maternal Exam:  Uterine Assessment: Contraction frequency is irregular.   Abdomen: Patient reports no abdominal tenderness. Fundal height is appropriate for gestation.   Estimated fetal weight is 7-8#.   Fetal presentation: vertex  Introitus: Normal vulva. Normal vagina.  Cervix: Cervix evaluated by digital exam.     Physical Exam Constitutional:      Appearance: Normal appearance.  HENT:     Head: Normocephalic and atraumatic.  Cardiovascular:     Rate and Rhythm: Normal rate and regular rhythm.  Pulmonary:     Effort: Pulmonary effort is normal.     Breath sounds: Normal breath sounds.  Abdominal:     General: Bowel sounds are normal.     Palpations: Abdomen is soft.     Comments: gravid  Genitourinary:    General: Normal vulva.  Musculoskeletal:        General: Normal range of motion.     Cervical back: Normal range of motion and neck supple.  Skin:    General: Skin is warm and dry.  Neurological:     General: No focal deficit present.     Mental Status: She is alert and oriented to person, place, and  time.  Psychiatric:        Mood and Affect: Mood normal.        Behavior: Behavior normal.     Prenatal labs: ABO, Rh: --/--/A POS (04/08 1157) Antibody:  neg Rubella:  immune RPR:   NR HBsAg:   neg HIV:   neg GBS:   POSITIVE  hgb 12.9/plt 316/ Ur Cx neg/GC pos, TOC neg/Chl neg/Varicella immune/Hgb electro WNL/Pp WNL/HCV neg/glucola 115/on viability IS 6 wk, confirmed at 10wk/ Panorama low risk, female;   [place] nl anat, female, marg previa [place] previa resolved  Assessment/Plan: 24yoG2P1001 at 69 for elective IOL, also w N/V GBBS + - will give PCN Induce with cytotec, AROM and pitocin Expect SVD IV pain meds and/or epidurral for pain   Anaysia Germer Bovard-Stuckert 02/16/2020, 10:34 PM

## 2020-02-18 ENCOUNTER — Inpatient Hospital Stay (HOSPITAL_COMMUNITY): Payer: Medicaid Other | Admitting: Anesthesiology

## 2020-02-18 ENCOUNTER — Inpatient Hospital Stay (HOSPITAL_COMMUNITY)
Admission: AD | Admit: 2020-02-18 | Discharge: 2020-02-20 | DRG: 806 | Disposition: A | Discharge status: Home / Self Care | Payer: Medicaid Other | Attending: Obstetrics and Gynecology | Admitting: Obstetrics and Gynecology

## 2020-02-18 ENCOUNTER — Inpatient Hospital Stay (HOSPITAL_COMMUNITY): Payer: Medicaid Other

## 2020-02-18 ENCOUNTER — Other Ambulatory Visit: Payer: Self-pay

## 2020-02-18 ENCOUNTER — Encounter (HOSPITAL_COMMUNITY): Payer: Self-pay | Admitting: Obstetrics and Gynecology

## 2020-02-18 DIAGNOSIS — Z3A39 39 weeks gestation of pregnancy: Secondary | ICD-10-CM

## 2020-02-18 DIAGNOSIS — O9081 Anemia of the puerperium: Secondary | ICD-10-CM | POA: Diagnosis not present

## 2020-02-18 DIAGNOSIS — O26893 Other specified pregnancy related conditions, third trimester: Secondary | ICD-10-CM | POA: Diagnosis present

## 2020-02-18 DIAGNOSIS — O99824 Streptococcus B carrier state complicating childbirth: Principal | ICD-10-CM | POA: Diagnosis present

## 2020-02-18 DIAGNOSIS — D62 Acute posthemorrhagic anemia: Secondary | ICD-10-CM | POA: Diagnosis not present

## 2020-02-18 DIAGNOSIS — Z3490 Encounter for supervision of normal pregnancy, unspecified, unspecified trimester: Secondary | ICD-10-CM

## 2020-02-18 LAB — CBC
HCT: 33.2 % — ABNORMAL LOW (ref 36.0–46.0)
Hemoglobin: 10.3 g/dL — ABNORMAL LOW (ref 12.0–15.0)
MCH: 25.3 pg — ABNORMAL LOW (ref 26.0–34.0)
MCHC: 31 g/dL (ref 30.0–36.0)
MCV: 81.6 fL (ref 80.0–100.0)
Platelets: 301 10*3/uL (ref 150–400)
RBC: 4.07 MIL/uL (ref 3.87–5.11)
RDW: 12.9 % (ref 11.5–15.5)
WBC: 10.6 10*3/uL — ABNORMAL HIGH (ref 4.0–10.5)
nRBC: 0 % (ref 0.0–0.2)

## 2020-02-18 LAB — RPR: RPR Ser Ql: NONREACTIVE

## 2020-02-18 LAB — TYPE AND SCREEN
ABO/RH(D): A POS
Antibody Screen: NEGATIVE

## 2020-02-18 MED ORDER — MISOPROSTOL 25 MCG QUARTER TABLET: 25.0000 ug | ORAL_TABLET | Freq: Three times a day (TID) | ORAL | Status: DC | PRN

## 2020-02-18 MED ORDER — EPHEDRINE 5 MG/ML INJ: 10.0000 mg | INTRAVENOUS | Status: DC | PRN

## 2020-02-18 MED ORDER — LACTATED RINGERS IV SOLN
500.0000 mL | Freq: Once | INTRAVENOUS | Status: AC
  Administered 2020-02-18: 500 mL via INTRAVENOUS

## 2020-02-18 MED ORDER — SODIUM CHLORIDE 0.9 % IV SOLN
5.0000 10*6.[IU] | Freq: Once | INTRAVENOUS | Status: AC
  Administered 2020-02-18: 5 10*6.[IU] via INTRAVENOUS
  Filled 2020-02-18: qty 5

## 2020-02-18 MED ORDER — TERBUTALINE SULFATE 1 MG/ML IJ SOLN: 0.2500 mg | Freq: Once | INTRAMUSCULAR | Status: DC | PRN

## 2020-02-18 MED ORDER — LACTATED RINGERS IV SOLN: 500.0000 mL | INTRAVENOUS | Status: DC | PRN

## 2020-02-18 MED ORDER — LIDOCAINE HCL (PF) 1 % IJ SOLN: 30.0000 mL | INTRAMUSCULAR | Status: DC | PRN

## 2020-02-18 MED ORDER — DIPHENHYDRAMINE HCL 50 MG/ML IJ SOLN: 12.5000 mg | INTRAMUSCULAR | Status: DC | PRN

## 2020-02-18 MED ORDER — SOD CITRATE-CITRIC ACID 500-334 MG/5ML PO SOLN: 30.0000 mL | ORAL | Status: DC | PRN

## 2020-02-18 MED ORDER — PHENYLEPHRINE 40 MCG/ML (10ML) SYRINGE FOR IV PUSH (FOR BLOOD PRESSURE SUPPORT): 80.0000 ug | PREFILLED_SYRINGE | INTRAVENOUS | Status: DC | PRN

## 2020-02-18 MED ORDER — OXYCODONE-ACETAMINOPHEN 5-325 MG PO TABS: 2.0000 | ORAL_TABLET | ORAL | Status: DC | PRN

## 2020-02-18 MED ORDER — LACTATED RINGERS IV SOLN
500.0000 mL | INTRAVENOUS | Status: DC | PRN
  Administered 2020-02-18: 250 mL via INTRAVENOUS

## 2020-02-18 MED ORDER — ACETAMINOPHEN 325 MG PO TABS: 650.0000 mg | ORAL_TABLET | ORAL | Status: DC | PRN

## 2020-02-18 MED ORDER — OXYTOCIN-SODIUM CHLORIDE 30-0.9 UT/500ML-% IV SOLN
1.0000 m[IU]/min | INTRAVENOUS | Status: DC
  Administered 2020-02-18: 2 m[IU]/min via INTRAVENOUS
  Filled 2020-02-18: qty 500

## 2020-02-18 MED ORDER — ONDANSETRON HCL 4 MG/2ML IJ SOLN
4.0000 mg | Freq: Four times a day (QID) | INTRAMUSCULAR | Status: DC | PRN
  Administered 2020-02-18: 4 mg via INTRAVENOUS
  Filled 2020-02-18: qty 2

## 2020-02-18 MED ORDER — MISOPROSTOL 50MCG HALF TABLET
50.0000 ug | ORAL_TABLET | Freq: Three times a day (TID) | ORAL | Status: DC | PRN
  Administered 2020-02-18: 50 ug via ORAL
  Filled 2020-02-18: qty 1

## 2020-02-18 MED ORDER — ONDANSETRON HCL 4 MG/2ML IJ SOLN: 4.0000 mg | Freq: Four times a day (QID) | INTRAMUSCULAR | Status: DC | PRN

## 2020-02-18 MED ORDER — OXYTOCIN BOLUS FROM INFUSION: 333.0000 mL | Freq: Once | INTRAVENOUS | Status: DC

## 2020-02-18 MED ORDER — LIDOCAINE HCL (PF) 1 % IJ SOLN
INTRAMUSCULAR | Status: DC | PRN
  Administered 2020-02-18: 5 mL via EPIDURAL

## 2020-02-18 MED ORDER — PENICILLIN G POT IN DEXTROSE 60000 UNIT/ML IV SOLN
3.0000 10*6.[IU] | INTRAVENOUS | Status: DC
  Administered 2020-02-18 (×3): 3 10*6.[IU] via INTRAVENOUS
  Filled 2020-02-18 (×3): qty 50

## 2020-02-18 MED ORDER — BUTORPHANOL TARTRATE 1 MG/ML IJ SOLN: 1.0000 mg | INTRAMUSCULAR | Status: DC | PRN

## 2020-02-18 MED ORDER — OXYCODONE-ACETAMINOPHEN 5-325 MG PO TABS: 1.0000 | ORAL_TABLET | ORAL | Status: DC | PRN

## 2020-02-18 MED ORDER — LACTATED RINGERS IV SOLN: INTRAVENOUS | Status: DC

## 2020-02-18 MED ORDER — FENTANYL-BUPIVACAINE-NACL 0.5-0.125-0.9 MG/250ML-% EP SOLN
12.0000 mL/h | EPIDURAL | Status: DC | PRN
  Filled 2020-02-18: qty 250

## 2020-02-18 MED ORDER — OXYTOCIN BOLUS FROM INFUSION
333.0000 mL | Freq: Once | INTRAVENOUS | Status: AC
  Administered 2020-02-18: 333 mL via INTRAVENOUS

## 2020-02-18 MED ORDER — OXYTOCIN-SODIUM CHLORIDE 30-0.9 UT/500ML-% IV SOLN: 2.5000 [IU]/h | INTRAVENOUS | Status: DC

## 2020-02-18 MED ORDER — SODIUM CHLORIDE (PF) 0.9 % IJ SOLN
INTRAMUSCULAR | Status: DC | PRN
  Administered 2020-02-18: 12 mL/h via EPIDURAL

## 2020-02-18 NOTE — Anesthesia Procedure Notes (Signed)
Epidural Patient location during procedure: OB Start time: 02/18/2020 1:56 PM End time: 02/18/2020 2:09 PM  Staffing Anesthesiologist: Trevor Iha, MD Performed: anesthesiologist   Preanesthetic Checklist Completed: patient identified, IV checked, site marked, risks and benefits discussed, surgical consent, monitors and equipment checked, pre-op evaluation and timeout performed  Epidural Patient position: sitting Prep: DuraPrep and site prepped and draped Patient monitoring: continuous pulse ox and blood pressure Approach: midline Location: L3-L4 Injection technique: LOR air  Needle:  Needle type: Tuohy  Needle gauge: 17 G Needle length: 9 cm and 9 Needle insertion depth: 6 cm Catheter type: closed end flexible Catheter size: 19 Gauge Catheter at skin depth: 12 cm Test dose: negative  Assessment Events: blood not aspirated, injection not painful, no injection resistance, no paresthesia and negative IV test  Additional Notes Patient identified. Risks/Benefits/Options discussed with patient including but not limited to bleeding, infection, nerve damage, paralysis, failed block, incomplete pain control, headache, blood pressure changes, nausea, vomiting, reactions to medication both or allergic, itching and postpartum back pain. Confirmed with bedside nurse the patient's most recent platelet count. Confirmed with patient that they are not currently taking any anticoagulation, have any bleeding history or any family history of bleeding disorders. Patient expressed understanding and wished to proceed. All questions were answered. Sterile technique was used throughout the entire procedure. Please see nursing notes for vital signs. Test dose was given through epidural needle and negative prior to continuing to dose epidural or start infusion. Warning signs of high block given to the patient including shortness of breath, tingling/numbness in hands, complete motor block, or any  concerning symptoms with instructions to call for help. Patient was given instructions on fall risk and not to get out of bed. All questions and concerns addressed with instructions to call with any issues.1  Attempt (S) . Patient tolerated procedure well.

## 2020-02-18 NOTE — Progress Notes (Signed)
Patient ID: [facility], female   DOB: [birth year], 24 y.o.   MRN: [id number]  Comfortable w epidural  AFVSS gen NAD FHTs 140's, mod var, early/variable decels, catergory 1 toco q 2-88min  6/80/0  Continue IOL, expect SVD

## 2020-02-18 NOTE — Progress Notes (Signed)
Patient ID: [facility], female   DOB: 12-02-1995, 24 y.o.   MRN: [id number]  Comfortable w epidural  AFVSS gen NAD  FHTs 130's, mod var, + accels, category 1 toco q  SVE 3.4/70/-2  IUPC placed w/o diff/comp  Continue IOL, expect SVD

## 2020-02-18 NOTE — H&P (Signed)
[facility] Alexandra Spencer is a 24 y.o. female G2P1001 at 34 with N/V, elective at ter.  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  Gonorrhea at [facility] w/u, TOC neg.  Had placenta previa, resolved.  Pregnancy w GERD and asthme.  Pregnancy dated by 6 wk [place].  D/W pt r/b/a and process of IOL.  Received Tdap 9/8.    OB History    Gravida  2   Para  1   Term  1   Preterm      AB      Living  1     SAB      TAB      Ectopic      Multiple  0   Live Births  1         G1 female 7#1, SVD 5/16 G2 present  no abn pap + Chl, +GC  Past Medical History:  Diagnosis Date  . Chlamydia   . Kidney stone   . Medical history non-contributory    Past Surgical History:  Procedure Laterality Date  . NO PAST SURGERIES     Family History: family history includes Breast cancer in her maternal grandmother and mother; Cancer in her maternal grandfather and maternal uncle. Social History:  reports that she has never smoked. She has never used smokeless tobacco. She reports that she does not drink alcohol and does not use drugs.  Waitress, single  Meds Flonase, Protonix, Proventil All NKDA     Maternal Diabetes: No Genetic Screening: Normal Maternal Ultrasounds/Referrals: Normal Fetal Ultrasounds or other Referrals:  None Maternal Substance Abuse:  No Significant Maternal Medications:  None Significant Maternal Lab Results:  Group B Strep positive Other Comments:  None  Review of Systems  Constitutional: Negative.   HENT: Negative.   Eyes: Negative.   Respiratory: Negative.   Cardiovascular: Negative.   Gastrointestinal: Positive for abdominal pain and nausea.  Genitourinary: Negative.   Musculoskeletal: Positive for back pain.  Skin: Negative.   Neurological: Negative.   Psychiatric/Behavioral: Negative.    Maternal Medical History:  Reason for admission: Nausea.   Contractions: Frequency: irregular.    Fetal activity: Perceived fetal activity is normal.    Prenatal complications: N&V  Prenatal  Complications - Diabetes: none.      Blood pressure 115/81, pulse 95, resp. rate 18, height 5\' 3"  (1.6 m), weight 71.9 kg, last menstrual period 05/02/2019, unknown if currently breastfeeding. Maternal Exam:  Uterine Assessment: Contraction frequency is irregular.   Abdomen: Patient reports no abdominal tenderness. Fundal height is appropriate for gestation.   Estimated fetal weight is 7-8#.   Fetal presentation: vertex  Introitus: Normal vulva. Normal vagina.  Cervix: Cervix evaluated by digital exam.     Physical Exam Constitutional:      Appearance: Normal appearance.  HENT:     Head: Normocephalic and atraumatic.  Cardiovascular:     Rate and Rhythm: Normal rate and regular rhythm.  Pulmonary:     Effort: Pulmonary effort is normal.     Breath sounds: Normal breath sounds.  Abdominal:     General: Bowel sounds are normal.     Palpations: Abdomen is soft.     Comments: gravid  Genitourinary:    General: Normal vulva.  Musculoskeletal:        General: Normal range of motion.     Cervical back: Normal range of motion and neck supple.  Skin:    General: Skin is warm and dry.  Neurological:     General: No focal deficit  present.     Mental Status: She is alert and oriented to person, place, and time.  Psychiatric:        Mood and Affect: Mood normal.        Behavior: Behavior normal.     Prenatal labs: ABO, Rh: --/--/A POS (04/08 1157) Antibody:  neg Rubella:  immune RPR:   NR HBsAg:   neg HIV:   neg GBS: Positive/-- (11/08 0000) POSITIVE  hgb 12.9/plt 316/ Ur Cx neg/GC pos, TOC neg/Chl neg/Varicella immune/Hgb electro WNL/Pp WNL/HCV neg/glucola 115/on viability IS 6 wk, confirmed at 10wk/ Panorama low risk, female;   [place] nl anat, female, marg previa [place] previa resolved  Assessment/Plan: 24yoG2P1001 at 89 for elective IOL, also w N/V GBBS + - will give PCN Induce with cytotec, AROM and pitocin Expect SVD IV pain meds and/or epidurral for pain   Beth Spackman  Bovard-Stuckert 02/18/2020, 8:16 AM

## 2020-02-18 NOTE — Progress Notes (Signed)
Patient ID: [facility], female   DOB: [birth year], 24 y.o.   MRN: [id number]  H&P reviewed, no changes Reviewed POC  AFVSS gen NAD FHTs 130-140's, mod var, category 1, + accels toco q 3-4 min  Will give oral cytotec for ripening, will perform AROM when able, pitocin to induce PCN for GBBS + Expect SVD

## 2020-02-18 NOTE — Anesthesia Preprocedure Evaluation (Addendum)
Anesthesia Evaluation  Patient identified by MRN, date of birth, ID band Patient awake    Reviewed: Allergy & Precautions, NPO status , Patient's Chart, lab work & pertinent test results  Airway Mallampati: II  TM Distance: >3 FB Neck ROM: Full    Dental no notable dental hx. (+) Teeth Intact, Dental Advisory Given   Pulmonary neg pulmonary ROS,  covid in Sept 2021 no test this admission   Pulmonary exam normal breath sounds clear to auscultation       Cardiovascular Exercise Tolerance: Good negative cardio ROS Normal cardiovascular exam Rhythm:Regular Rate:Normal     Neuro/Psych negative neurological ROS  negative psych ROS   GI/Hepatic negative GI ROS, Neg liver ROS,   Endo/Other  negative endocrine ROS  Renal/GU      Musculoskeletal negative musculoskeletal ROS (+)   Abdominal   Peds  Hematology negative hematology ROS (+) Hgb 10.3 Plt 301   Anesthesia Other Findings   Reproductive/Obstetrics (+) Pregnancy                            Anesthesia Physical Anesthesia Plan  ASA: II  Anesthesia Plan: Epidural   Post-op Pain Management:    Induction:   PONV Risk Score and Plan:   Airway Management Planned:   Additional Equipment:   Intra-op Plan:   Post-operative Plan:   Informed Consent: I have reviewed the patients History and Physical, chart, labs and discussed the procedure including the risks, benefits and alternatives for the proposed anesthesia with the patient or authorized representative who has indicated his/her understanding and acceptance.       Plan Discussed with: CRNA  Anesthesia Plan Comments: (39.2 Wk G2P1 for LEA)       Anesthesia Quick Evaluation

## 2020-02-18 NOTE — Progress Notes (Signed)
Patient ID: [facility], female   DOB: 1995/04/06, 24 y.o.   MRN: [id number]  Some pressure, comfortable w epidural  AFVSS gen NAD  FHTs 130's mod var,  Early/variable decels, category 2 toco q  SVE 9.5/100/+1  Position change, continue IOL Expect SVD soon

## 2020-02-18 NOTE — Progress Notes (Signed)
Patient ID: [facility], female   DOB: 1996/01/20, 24 y.o.   MRN: [id number]  +FM, no LOF, no VB, occ ctx  AFVSS gen NAD FHTs 130-140's, mod var, + accels, scalp stim w SVE, category 1 toco  q 2-77min  AROM for clear fluid 2-3/70/-2  Continue IOL, add pitocin - expect SVD

## 2020-02-19 LAB — CBC
HCT: 28 % — ABNORMAL LOW (ref 36.0–46.0)
Hemoglobin: 8.9 g/dL — ABNORMAL LOW (ref 12.0–15.0)
MCH: 25.6 pg — ABNORMAL LOW (ref 26.0–34.0)
MCHC: 31.8 g/dL (ref 30.0–36.0)
MCV: 80.5 fL (ref 80.0–100.0)
Platelets: 304 10*3/uL (ref 150–400)
RBC: 3.48 MIL/uL — ABNORMAL LOW (ref 3.87–5.11)
RDW: 12.9 % (ref 11.5–15.5)
WBC: 15.9 10*3/uL — ABNORMAL HIGH (ref 4.0–10.5)
nRBC: 0 % (ref 0.0–0.2)

## 2020-02-19 MED ORDER — DIBUCAINE (PERIANAL) 1 % EX OINT: 1.0000 "application " | TOPICAL_OINTMENT | CUTANEOUS | Status: DC | PRN

## 2020-02-19 MED ORDER — PRENATAL MULTIVITAMIN CH
1.0000 | ORAL_TABLET | Freq: Every day | ORAL | Status: DC
  Administered 2020-02-19: 1 via ORAL
  Filled 2020-02-19: qty 1

## 2020-02-19 MED ORDER — LACTATED RINGERS IV SOLN: INTRAVENOUS | Status: DC

## 2020-02-19 MED ORDER — ZOLPIDEM TARTRATE 5 MG PO TABS: 5.0000 mg | ORAL_TABLET | Freq: Every evening | ORAL | Status: DC | PRN

## 2020-02-19 MED ORDER — SIMETHICONE 80 MG PO CHEW: 80.0000 mg | CHEWABLE_TABLET | ORAL | Status: DC | PRN

## 2020-02-19 MED ORDER — ONDANSETRON HCL 4 MG PO TABS: 4.0000 mg | ORAL_TABLET | ORAL | Status: DC | PRN

## 2020-02-19 MED ORDER — ALBUTEROL SULFATE HFA 108 (90 BASE) MCG/ACT IN AERS
1.0000 | INHALATION_SPRAY | Freq: Four times a day (QID) | RESPIRATORY_TRACT | Status: DC | PRN
  Filled 2020-02-19: qty 6.7

## 2020-02-19 MED ORDER — ONDANSETRON HCL 4 MG/2ML IJ SOLN: 4.0000 mg | INTRAMUSCULAR | Status: DC | PRN

## 2020-02-19 MED ORDER — OXYCODONE HCL 5 MG PO TABS: 10.0000 mg | ORAL_TABLET | ORAL | Status: DC | PRN

## 2020-02-19 MED ORDER — ACETAMINOPHEN 325 MG PO TABS
650.0000 mg | ORAL_TABLET | ORAL | Status: DC | PRN
  Administered 2020-02-19 – 2020-02-20 (×5): 650 mg via ORAL
  Filled 2020-02-19 (×5): qty 2

## 2020-02-19 MED ORDER — BENZOCAINE-MENTHOL 20-0.5 % EX AERO
1.0000 "application " | INHALATION_SPRAY | CUTANEOUS | Status: DC | PRN
  Administered 2020-02-19: 1 via TOPICAL
  Filled 2020-02-19: qty 56

## 2020-02-19 MED ORDER — OXYCODONE HCL 5 MG PO TABS
5.0000 mg | ORAL_TABLET | ORAL | Status: DC | PRN
  Administered 2020-02-19 – 2020-02-20 (×2): 5 mg via ORAL
  Filled 2020-02-19 (×2): qty 1

## 2020-02-19 MED ORDER — IBUPROFEN 600 MG PO TABS
600.0000 mg | ORAL_TABLET | Freq: Four times a day (QID) | ORAL | Status: DC
  Administered 2020-02-19 – 2020-02-20 (×5): 600 mg via ORAL
  Filled 2020-02-19 (×5): qty 1

## 2020-02-19 MED ORDER — COCONUT OIL OIL
1.0000 "application " | TOPICAL_OIL | Status: DC | PRN
  Administered 2020-02-19: 1 via TOPICAL

## 2020-02-19 MED ORDER — TETANUS-DIPHTH-ACELL PERTUSSIS 5-2.5-18.5 LF-MCG/0.5 IM SUSY: 0.5000 mL | PREFILLED_SYRINGE | Freq: Once | INTRAMUSCULAR | Status: DC

## 2020-02-19 MED ORDER — DIPHENHYDRAMINE HCL 25 MG PO CAPS: 25.0000 mg | ORAL_CAPSULE | Freq: Four times a day (QID) | ORAL | Status: DC | PRN

## 2020-02-19 MED ORDER — ONDANSETRON 4 MG PO TBDP: 4.0000 mg | ORAL_TABLET | Freq: Three times a day (TID) | ORAL | Status: DC | PRN

## 2020-02-19 MED ORDER — SENNOSIDES-DOCUSATE SODIUM 8.6-50 MG PO TABS
2.0000 | ORAL_TABLET | ORAL | Status: DC
  Administered 2020-02-19: 2 via ORAL
  Filled 2020-02-19: qty 2

## 2020-02-19 MED ORDER — WITCH HAZEL-GLYCERIN EX PADS: 1.0000 "application " | MEDICATED_PAD | CUTANEOUS | Status: DC | PRN

## 2020-02-19 NOTE — Anesthesia Postprocedure Evaluation (Signed)
Anesthesia Post Note  Patient: Alexandra Spencer  Procedure(s) Performed: AN AD HOC LABOR EPIDURAL     Patient location during evaluation: Mother Baby Anesthesia Type: Epidural Level of consciousness: awake Pain management: satisfactory to patient Vital Signs Assessment: post-procedure vital signs reviewed and stable Respiratory status: spontaneous breathing Cardiovascular status: stable Anesthetic complications: no   No complications documented.  Last Vitals:  Vitals:   02/19/20 0235 02/19/20 0634  BP: (!) 107/56 107/63  Pulse: 80 72  Resp:    Temp: 37.1 C 36.6 C  SpO2:      Last Pain:  Vitals:   02/19/20 0740  TempSrc:   PainSc: 3    Pain Goal: Patients Stated Pain Goal: 5 (02/18/20 1500)                 Cephus Shelling

## 2020-02-19 NOTE — Lactation Note (Addendum)
This note was copied from a baby's chart. Lactation Consultation Note  Patient Name: Alexandra Spencer Today's Date: 02/19/2020 Reason for consult: Initial assessment;Term;Primapara;1st time breastfeeding  P1 mother whose infant is now 77 hours old.  This is a term baby at 39+2 weeks.  RN requested latch assistance.  [facility] consult entered by RN at [phone] today.  Mother was trying to latch when I arrived.  Offered to assist and mother agreeable.  Positioned pillows and attempted to latch in the football hold to the right breast; baby fussy and not interested.  Mother did hand expression and I finger fed colostrum drops to baby.  She had an uncoordinated grasp and suck initially which became stronger as I fed the drops.  Assisted to latch to the left breast in the football hold with success.  Even though baby was sleepy she responded well to gentle stimulation.  Breast feeding basics reviewed while observing baby sucking.  Reminded mother to use gentle stimulation during feedings to keep baby actively engaged.  Mother verbalized understanding.    Baby does have a lingual frenulum which the RN had mentioned to parents.  Discussed how to latch correctly and deeply.  Mother denied pain with feeding.  Instructed to use EBM/coconut oil for comfort.    Mother will continue to feed on cue or at least 8-12 times/24 hours.  She will call for latch assistance as needed.  Mother requested to begin pumping with the DEBP.  Pump parts, assembly, disassembly and cleaning reviewed while mother breast fed.  RN will return after feeding to demonstrate the pump and assess flange size at the breast.  Mother does not have a DEBP for home use.  She is a [facility] participant in [place] but plans to accept the [employer].  Father present.  RN updated.   Maternal Data Formula Feeding for Exclusion: Yes Reason for exclusion: Mother's choice to formula and breast feed on admission Has patient been taught Hand  Expression?: Yes Does the patient have breastfeeding experience prior to this delivery?: No  Feeding Feeding Type: Breast Fed  LATCH Score Latch: Repeated attempts needed to sustain latch, nipple held in mouth throughout feeding, stimulation needed to elicit sucking reflex.  Audible Swallowing: None  Type of Nipple: Everted at rest and after stimulation  Comfort (Breast/Nipple): Soft / non-tender  Hold (Positioning): Assistance needed to correctly position infant at breast and maintain latch.  LATCH Score: 6  Interventions Interventions: Breast feeding basics reviewed;Assisted with latch;Skin to skin;Breast massage;Hand express;Breast compression;Adjust position;DEBP;Hand pump;Position options;Support pillows  Lactation Tools Discussed/Used WIC Program: Yes Pump Review: Setup, frequency, and cleaning;Milk Storage (LC reviewed parts and cleaning; RN to review set up after breast feeding) Initiated by:: Laureen Ochs Date initiated:: 02/19/20   Consult Status Consult Status: Follow-up Date: 02/20/20 Follow-up type: In-patient    Dora Sims 02/19/2020, 10:43 AM

## 2020-02-19 NOTE — Progress Notes (Signed)
Post Partum Day 1 Subjective: no complaints, up ad lib, voiding, tolerating PO and nl lochia, pain controlled.  tolerating blood loss anemia.    Objective: Blood pressure 107/63, pulse 72, temperature 97.8 F (36.6 C), temperature source Oral, resp. rate 18, height 5\' 3"  (1.6 m), weight 71.9 kg, last menstrual period 05/02/2019, SpO2 99 %, unknown if currently breastfeeding.  Physical Exam:  General: alert and no distress Lochia: appropriate Uterine Fundus: firm  Recent Labs    02/18/20 0815 02/19/20 0636  HGB 10.3* 8.9*  HCT 33.2* 28.0*    Assessment/Plan: Plan for discharge tomorrow, Breastfeeding and Lactation consult.  Routine pp care.     LOS: 1 day   Sahira Cataldi Bovard-Stuckert 02/19/2020, 9:25 AM

## 2020-02-20 MED ORDER — IBUPROFEN 600 MG PO TABS
600.0000 mg | ORAL_TABLET | Freq: Four times a day (QID) | ORAL | 0 refills | Status: DC
Start: 2020-02-20 — End: 2022-03-06

## 2020-02-20 NOTE — Progress Notes (Signed)
PPD #2 Doing well, some cramps, ready to go home Afeb, VSS Fundus firm at U-1 D/c home
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
## 2020-02-20 NOTE — Discharge Instructions (Signed)
As per discharge pamphlet °

## 2020-02-20 NOTE — Discharge Summary (Signed)
Postpartum Discharge Summary      Patient Name: Alexandra Spencer DOB: [birth year] MRN: [id number]  Date of admission: 02/18/2020 Delivery date:02/18/2020  Delivering provider: Sherian Rein  Date of discharge: 02/20/2020  Admitting diagnosis: Normal pregnancy [Z34.90] Term pregnancy [Z34.90] Intrauterine pregnancy: [redacted]w[redacted]d     Secondary diagnosis:  Principal Problem:   SVD (spontaneous vaginal delivery) Active Problems:   Normal pregnancy   Term pregnancy     Discharge diagnosis: Term Pregnancy Delivered                                              Hospital course: Onset of Labor With Vaginal Delivery      24 y.o. yo [id number] at [redacted]w[redacted]d was admitted in Latent Labor on 02/18/2020. Patient had an uncomplicated labor course as follows:  Membrane Rupture Time/Date: 12:52 PM ,02/18/2020   Delivery Method:Vaginal, Spontaneous  Episiotomy: None  Lacerations:  None  Patient had an uncomplicated postpartum course.  She is ambulating, tolerating a regular diet, passing flatus, and urinating well. Patient is discharged home in stable condition on 02/20/20.  Newborn Data: Birth date:02/18/2020  Birth time:10:35 PM  Gender:Female  Living status:Living  Apgars:9 ,9  Weight:3419 g    Physical exam  Vitals:   02/19/20 1501 02/19/20 2035 02/20/20 0205 02/20/20 0556  BP: 109/63 103/62 108/62 (!) 96/51  Pulse: 69 78 71 72  Resp: 20 18 18 18   Temp: 98.7 F (37.1 C) 97.8 F (36.6 C)  98.2 F (36.8 C)  TempSrc:  Oral  Oral  SpO2:  98% 100% 100%  Weight:      Height:       General: alert Lochia: appropriate Uterine Fundus: firm  Labs: Lab Results  Component Value Date   WBC 15.9 (H) 02/19/2020   HGB 8.9 (L) 02/19/2020   HCT 28.0 (L) 02/19/2020   MCV 80.5 02/19/2020   PLT 304 02/19/2020   CMP Latest Ref Rng & Units 06/30/2019  Glucose 70 - 99 mg/dL 96  BUN 6 - 20 mg/dL 08/30/2019)  Creatinine <0(Z - 1.00 mg/dL 0.09  Sodium 2.33 - 007 mmol/L 137  Potassium 3.5 - 5.1 mmol/L  3.9  Chloride 98 - 111 mmol/L 104  CO2 22 - 32 mmol/L 22  Calcium 8.9 - 10.3 mg/dL 8.9  Total Protein 6.5 - 8.1 g/dL 6.6  Total Bilirubin 0.3 - 1.2 mg/dL 0.7  Alkaline Phos 38 - 126 U/L 41  AST 15 - 41 U/L 15  ALT 0 - 44 U/L 13   Edinburgh Score: No flowsheet data found.    After visit meds:  Allergies as of 02/20/2020   No Known Allergies     Medication List    STOP taking these medications   benzonatate 100 MG capsule Commonly known as: TESSALON     TAKE these medications   albuterol 108 (90 Base) MCG/ACT inhaler Commonly known as: VENTOLIN HFA Inhale 1-2 puffs into the lungs every 6 (six) hours as needed for wheezing or shortness of breath.   fluticasone 50 MCG/ACT nasal spray Commonly known as: FLONASE Place 2 sprays into both nostrils daily for 10 days.   guaiFENesin 600 MG 12 hr tablet Commonly known as: Mucinex Take 2 tablets (1,200 mg total) by mouth 2 (two) times daily as needed.   ibuprofen 600 MG tablet Commonly known as: ADVIL Take 1 tablet (600  mg total) by mouth every 6 (six) hours.   metoCLOPramide 10 MG tablet Commonly known as: REGLAN Take 1 tablet (10 mg total) by mouth every 6 (six) hours.   ondansetron 4 MG disintegrating tablet Commonly known as: Zofran ODT Take 1 tablet (4 mg total) by mouth every 8 (eight) hours as needed.        Discharge home in stable condition Infant Feeding: Breast Infant Disposition:home with mother Discharge instruction: per After Visit Summary and Postpartum booklet. Activity: Advance as tolerated. Pelvic rest for 6 weeks.  Diet: routine diet  Postpartum Appointment:6 weeks Follow up Visit:  Follow-up Information    Bovard-Stuckert, Jody, MD. Schedule an appointment as soon as possible for a visit in 6 week(s).   Specialty: Obstetrics and Gynecology Contact information: [address] [place] [address] [phone]                   02/20/2020 Zenaida Niece, MD

## 2021-02-23 IMAGING — US US ABDOMEN LIMITED
1 series · 14 of 25 positions shown · non-contrast
Comparison: CT renal colic 04/18/2015

CLINICAL DATA: Thirty-five weeks pregnant, right upper quadrant
pain

EXAM:
ULTRASOUND ABDOMEN LIMITED RIGHT UPPER QUADRANT

[Series 1: us abdomen limited ruq (liver/gb) · 14 of 37 slices shown]
[im 1/37]
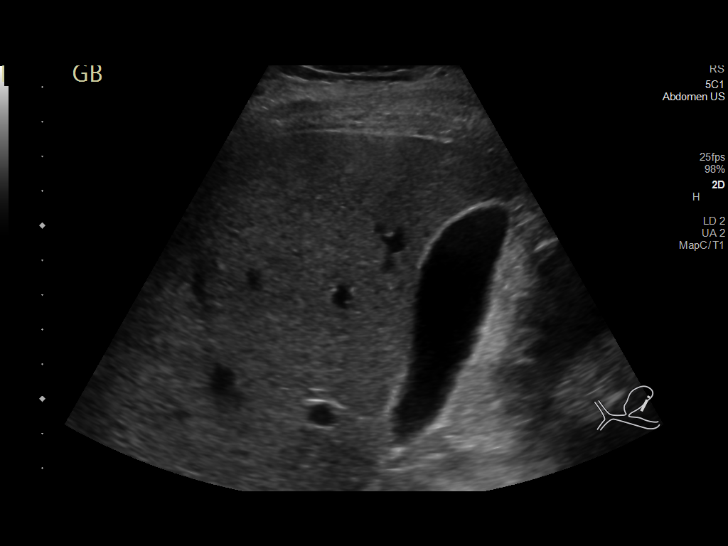
[im 4/37]
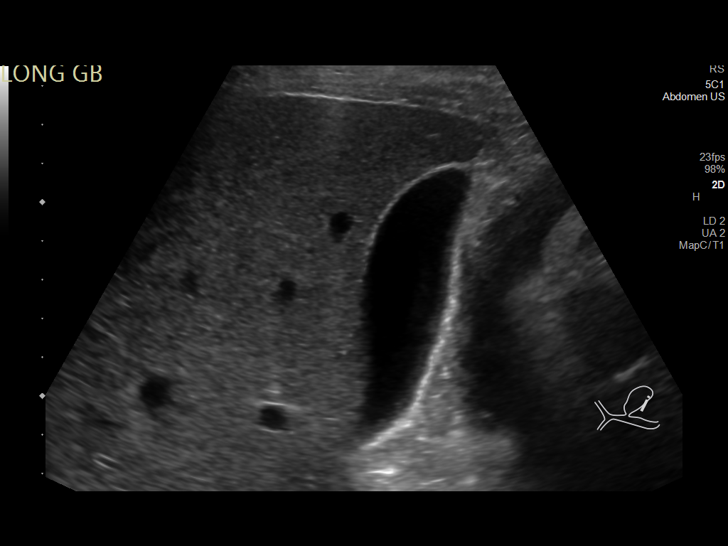
[im 7/37]
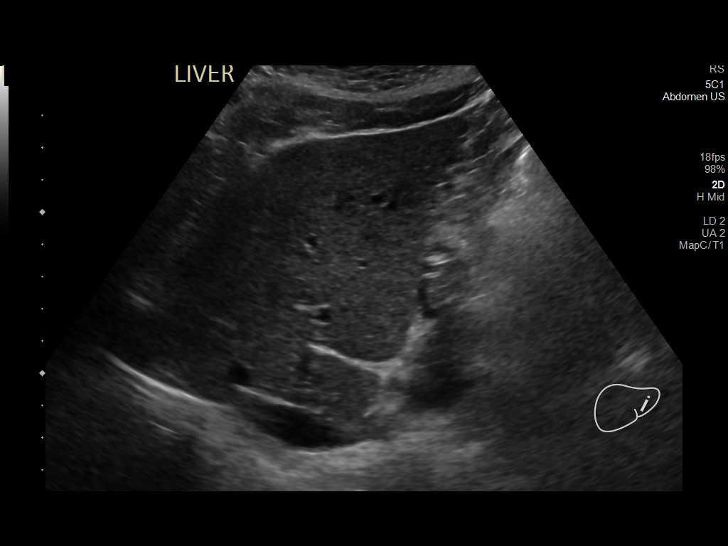
[im 10/37]
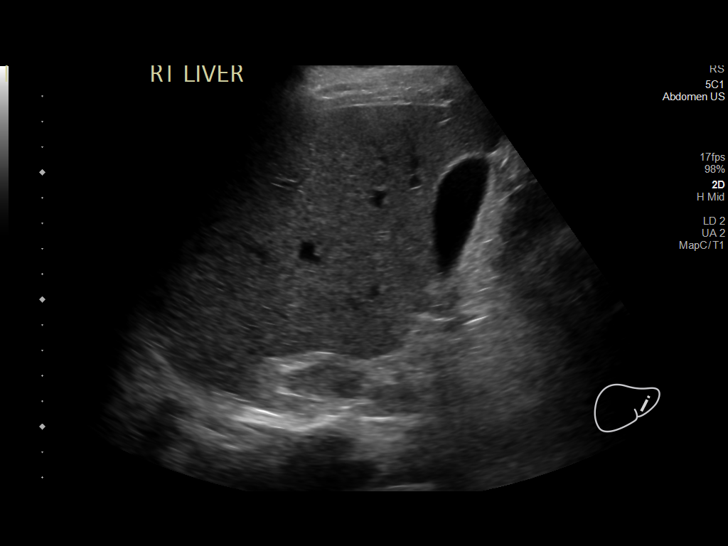
[im 13/37]
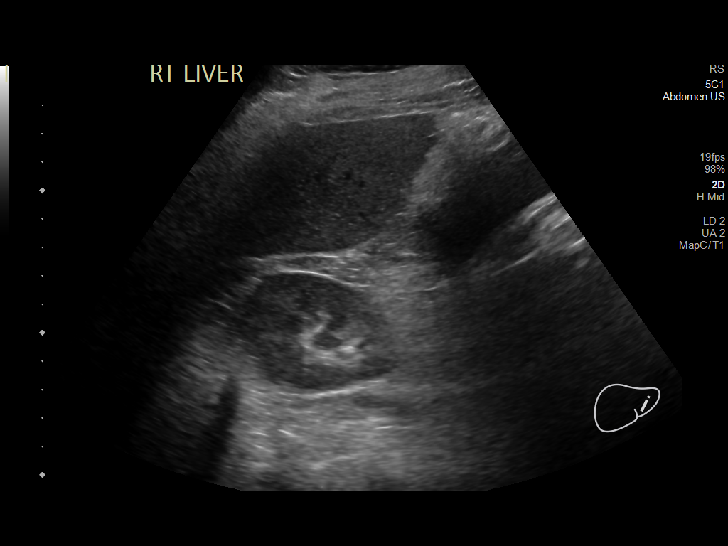
[im 14/37]
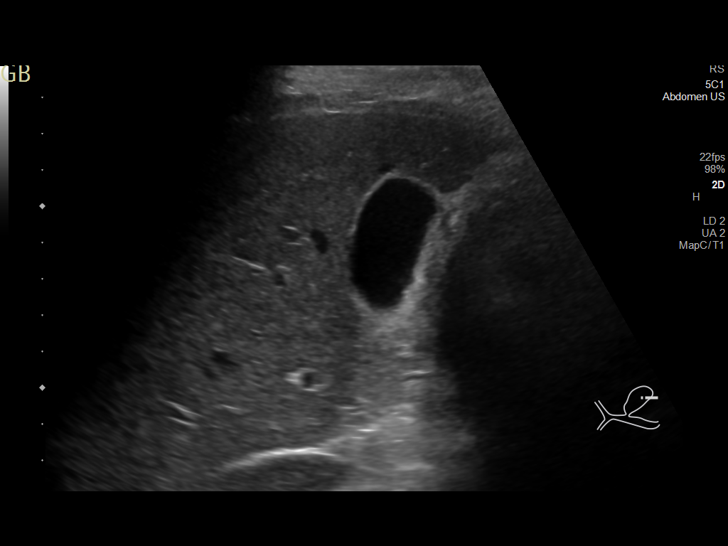
[im 17/37]
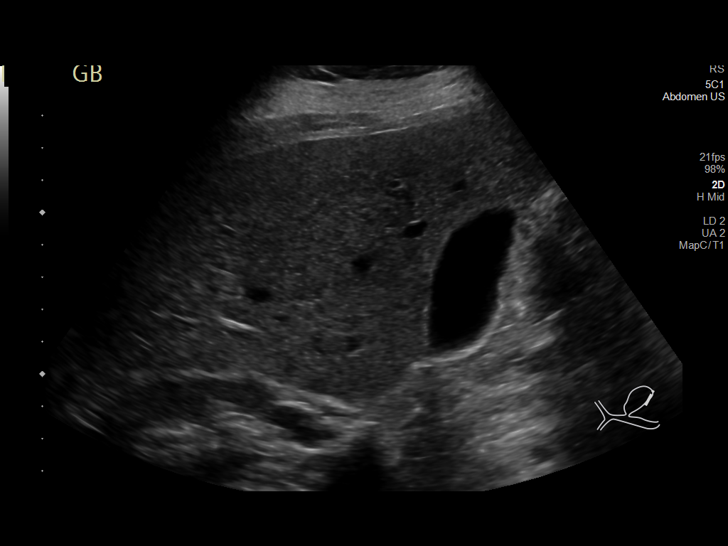
[im 20/37]
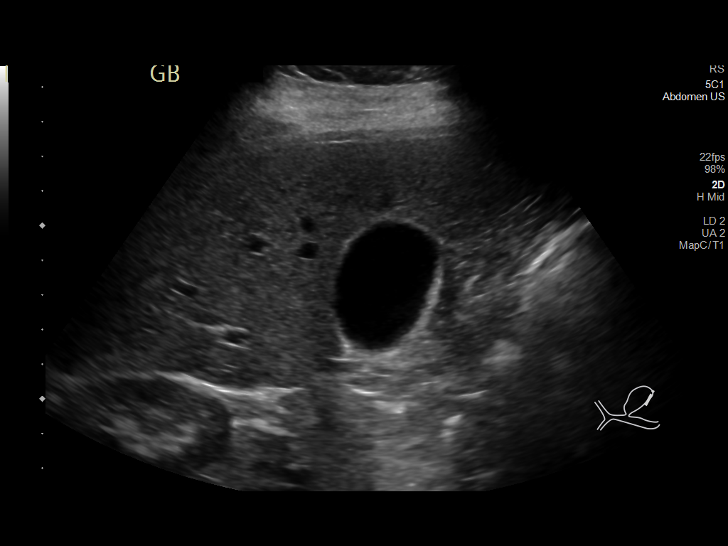
[im 23/37]
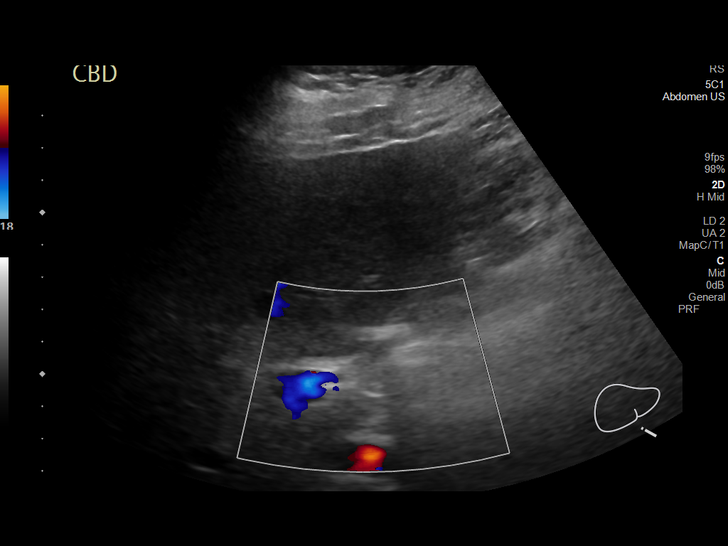
[im 25/37]
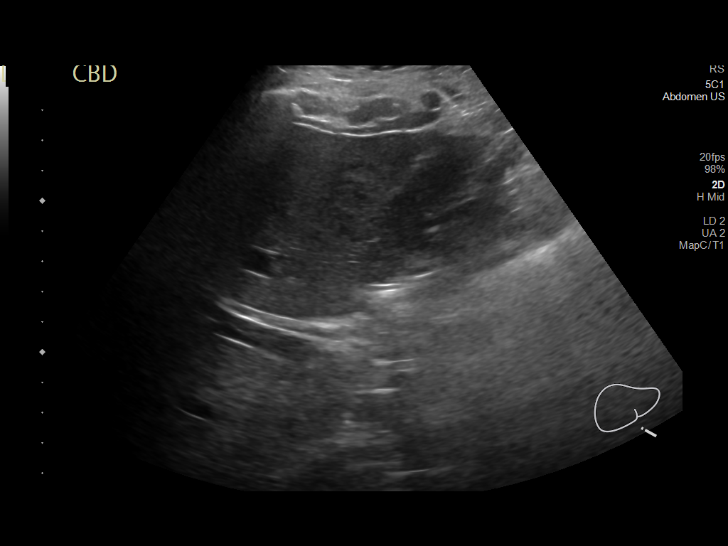
[im 28/37]
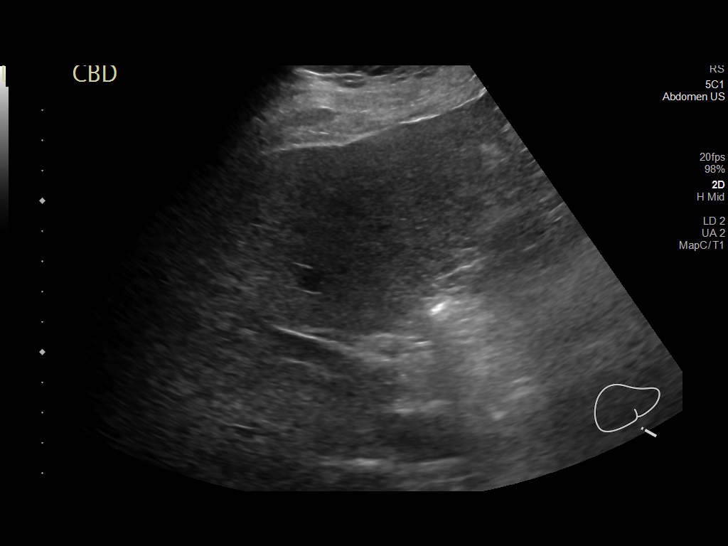
[im 31/37]
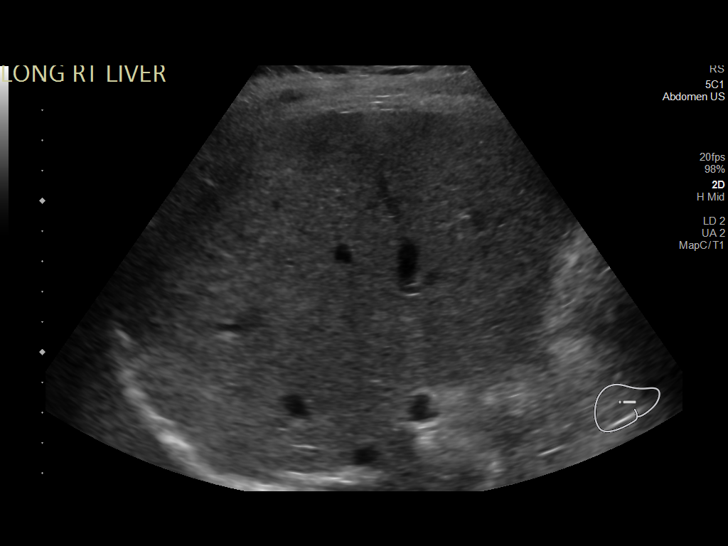
[im 34/37]
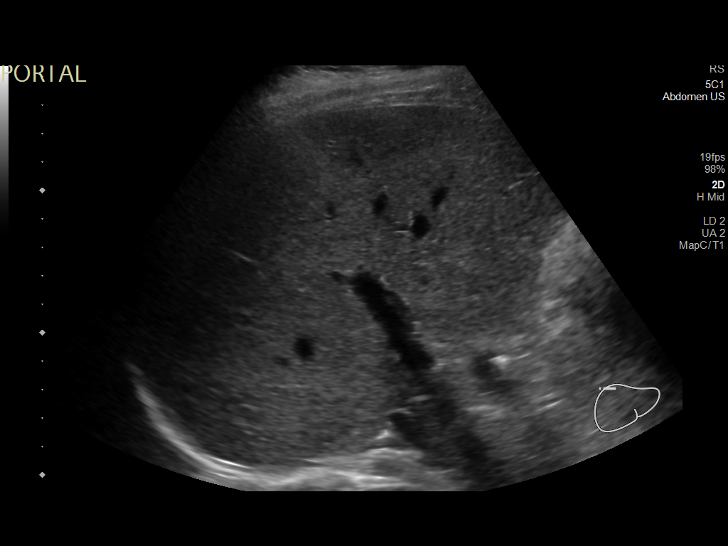
[im 37/37]
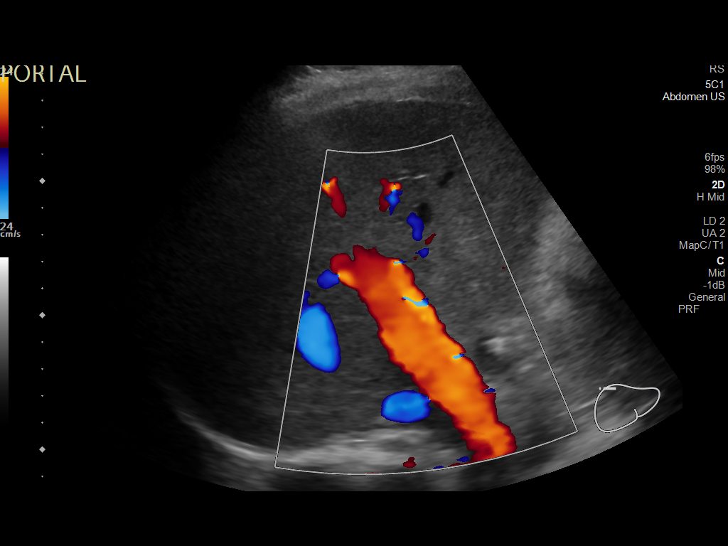

[14 of 25 positions shown; findings below may reference images not displayed]

FINDINGS: Gallbladder:

No gallstones or wall thickening visualized. No sonographic Murphy
sign noted by sonographer.

Common bile duct:

Diameter: 3 mm, nondilated.

Liver:

No focal lesion identified. Within normal limits in parenchymal
echogenicity. No intrahepatic biliary ductal dilatation. Portal vein
is patent on color Doppler imaging with normal direction of blood
flow towards the liver.

Other: None.
IMPRESSION: Unremarkable right upper quadrant ultrasound.

## 2021-04-20 ENCOUNTER — Other Ambulatory Visit: Payer: Self-pay

## 2021-04-20 ENCOUNTER — Emergency Department (HOSPITAL_BASED_OUTPATIENT_CLINIC_OR_DEPARTMENT_OTHER)
Admission: EM | Admit: 2021-04-20 | Discharge: 2021-04-20 | Disposition: A | Discharge status: Home / Self Care | Payer: Medicaid Other | Attending: Emergency Medicine | Admitting: Emergency Medicine

## 2021-04-20 ENCOUNTER — Encounter (HOSPITAL_BASED_OUTPATIENT_CLINIC_OR_DEPARTMENT_OTHER): Payer: Self-pay | Admitting: Emergency Medicine

## 2021-04-20 ENCOUNTER — Emergency Department (HOSPITAL_BASED_OUTPATIENT_CLINIC_OR_DEPARTMENT_OTHER): Payer: Medicaid Other

## 2021-04-20 DIAGNOSIS — R0789 Other chest pain: Secondary | ICD-10-CM | POA: Insufficient documentation

## 2021-04-20 DIAGNOSIS — R0781 Pleurodynia: Secondary | ICD-10-CM

## 2021-04-20 LAB — PREGNANCY, URINE: Preg Test, Ur: NEGATIVE

## 2021-04-20 MED ORDER — LIDOCAINE 5 % EX PTCH
1.0000 | MEDICATED_PATCH | CUTANEOUS | Status: DC
  Administered 2021-04-20: 1 via TRANSDERMAL
  Filled 2021-04-20: qty 1

## 2021-04-20 MED ORDER — METHOCARBAMOL 500 MG PO TABS: 500.0000 mg | ORAL_TABLET | Freq: Every evening | ORAL | 0 refills | Status: AC | PRN

## 2021-04-20 MED ORDER — LIDOCAINE 5 % EX PTCH: 1.0000 | MEDICATED_PATCH | CUTANEOUS | 0 refills | Status: DC
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
## 2021-04-20 NOTE — Discharge Instructions (Signed)
Continue taking ibuprofen. Take 3 tablets (600 mg) 3 times a day with meals.  Do not take other anti-inflammatories at the same time (Advil, Motrin, naproxen, Aleve). You may supplement with Tylenol if you need further pain control. Use ice packs or heating pads if this helps control your pain. Use the muscle relaxer to help with pain or spasm.  Have caution, this may make you tired or groggy.  Do not drive or operate heavy machinery when taking this medicine.  If you take it at night, you are fine to do your normal activities during the day. Use the numbing patch to help with your pain control. Return to the emergency room if you develop fevers, difficulty breathing, productive cough, or any new, worsening, or concerning symptoms
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
## 2022-03-06 ENCOUNTER — Other Ambulatory Visit: Payer: Self-pay

## 2022-03-06 ENCOUNTER — Emergency Department (HOSPITAL_BASED_OUTPATIENT_CLINIC_OR_DEPARTMENT_OTHER)
Admission: EM | Admit: 2022-03-06 | Discharge: 2022-03-06 | Disposition: A | Discharge status: Home / Self Care | Payer: Medicaid Other | Attending: Emergency Medicine | Admitting: Emergency Medicine

## 2022-03-06 ENCOUNTER — Encounter (HOSPITAL_BASED_OUTPATIENT_CLINIC_OR_DEPARTMENT_OTHER): Payer: Self-pay | Admitting: Emergency Medicine

## 2022-03-06 DIAGNOSIS — N3001 Acute cystitis with hematuria: Secondary | ICD-10-CM

## 2022-03-06 DIAGNOSIS — R1030 Lower abdominal pain, unspecified: Secondary | ICD-10-CM | POA: Diagnosis not present

## 2022-03-06 DIAGNOSIS — M545 Low back pain, unspecified: Secondary | ICD-10-CM | POA: Diagnosis present

## 2022-03-06 LAB — URINALYSIS, ROUTINE W REFLEX MICROSCOPIC
Bilirubin Urine: NEGATIVE
Glucose, UA: NEGATIVE mg/dL
Ketones, ur: NEGATIVE mg/dL
Nitrite: NEGATIVE
Protein, ur: NEGATIVE mg/dL
Specific Gravity, Urine: 1.025 (ref 1.005–1.030)
pH: 6 (ref 5.0–8.0)

## 2022-03-06 LAB — URINALYSIS, MICROSCOPIC (REFLEX)

## 2022-03-06 LAB — PREGNANCY, URINE: Preg Test, Ur: NEGATIVE

## 2022-03-06 MED ORDER — LIDOCAINE 5 % EX PTCH: 1.0000 | MEDICATED_PATCH | CUTANEOUS | 0 refills | Status: AC

## 2022-03-06 MED ORDER — IBUPROFEN 800 MG PO TABS: 800.0000 mg | ORAL_TABLET | Freq: Three times a day (TID) | ORAL | 0 refills | Status: AC

## 2022-03-06 MED ORDER — KETOROLAC TROMETHAMINE 15 MG/ML IJ SOLN
15.0000 mg | Freq: Once | INTRAMUSCULAR | Status: AC
  Administered 2022-03-06: 15 mg via INTRAMUSCULAR
  Filled 2022-03-06: qty 1

## 2022-03-06 MED ORDER — LIDOCAINE 5 % EX PTCH
1.0000 | MEDICATED_PATCH | CUTANEOUS | Status: DC
  Administered 2022-03-06: 1 via TRANSDERMAL
  Filled 2022-03-06: qty 1

## 2022-03-06 MED ORDER — CEPHALEXIN 500 MG PO CAPS: 500.0000 mg | ORAL_CAPSULE | Freq: Four times a day (QID) | ORAL | 0 refills | Status: DC

## 2022-03-06 NOTE — Discharge Instructions (Signed)
You are seen today in the emergency department for back pain.  Your urine does appear infected likely UTI, take Keflex 4 times daily for 5 days to cure that.  I suspect the back pain is more related to muscle, use the Lidoderm patch every 12 hours.  Can take ibuprofen 800 mg 3 times daily with food and water.

## 2022-03-06 NOTE — ED Provider Notes (Signed)
[facility] EMERGENCY DEPARTMENT Provider Note   CSN: [id number] Arrival date & time: 03/06/22  1321     History  Chief Complaint  Patient presents with   Back Pain    Alexandra Spencer is a 26 y.o. female.   Back Pain    Patient with medical history of previous kidney stones presents to the emergency department due to low back pain.  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  Source with any movement, denies any nausea, vomiting, dysuria, hematuria, flank pain, fevers, vaginal discharge.  Does not feel like previous kidney stones.  No previous back surgeries, denies saddle anesthesia, urine tension, fecal incontinence.  Home Medications Prior to Admission medications   Medication Sig Start Date End Date Taking? Authorizing Provider  albuterol (VENTOLIN HFA) 108 (90 Base) MCG/ACT inhaler Inhale 1-2 puffs into the lungs every 6 (six) hours as needed for wheezing or shortness of breath. 09/01/19   Long, Arlyss Repress, MD  fluticasone (FLONASE) 50 MCG/ACT nasal spray Place 2 sprays into both nostrils daily for 10 days. 09/01/19 09/11/19  Long, Arlyss Repress, MD  guaiFENesin (MUCINEX) 600 MG 12 hr tablet Take 2 tablets (1,200 mg total) by mouth 2 (two) times daily as needed. 12/04/19   Leftwich-Kirby, Wilmer Floor, CNM  ibuprofen (ADVIL) 600 MG tablet Take 1 tablet (600 mg total) by mouth every 6 (six) hours. 02/20/20   Meisinger, Todd, MD  lidocaine (LIDODERM) 5 % Place 1 patch onto the skin daily. Remove & Discard patch within 12 hours or as directed by MD 04/20/21   Caccavale, Sophia, PA-C  methocarbamol (ROBAXIN) 500 MG tablet Take 1 tablet (500 mg total) by mouth at bedtime as needed for muscle spasms. 04/20/21   Caccavale, Sophia, PA-C  metoCLOPramide (REGLAN) 10 MG tablet Take 1 tablet (10 mg total) by mouth every 6 (six) hours. 12/04/19   Leftwich-Kirby, Wilmer Floor, CNM  ondansetron  (ZOFRAN ODT) 4 MG disintegrating tablet Take 1 tablet (4 mg total) by mouth every 8 (eight) hours as needed. 09/01/19   Long, Arlyss Repress, MD      Allergies    Patient has no known allergies.    Review of Systems   Review of Systems  Musculoskeletal:  Positive for back pain.    Physical Exam Updated Vital Signs BP 126/77 (BP Location: Right Arm)   Pulse 79   Temp 98.2 F (36.8 C)   Resp 20   Ht 5\' 3"  (1.6 m)   Wt 65.8 kg   SpO2 96%   BMI 25.69 kg/m  Physical Exam Vitals and nursing note reviewed. Exam conducted with a chaperone present.  Constitutional:      Appearance: Normal appearance.  HENT:     Head: Normocephalic and atraumatic.  Eyes:     General: No scleral icterus.       Right eye: No discharge.        Left eye: No discharge.     Extraocular Movements: Extraocular movements intact.     Pupils: Pupils are equal, round, and reactive to light.  Cardiovascular:     Rate and Rhythm: Normal rate and regular rhythm.     Pulses: Normal pulses.     Heart sounds: Normal heart sounds. No murmur heard.    No friction rub. No gallop.  Pulmonary:     Effort: Pulmonary effort is normal. No respiratory  distress.     Breath sounds: Normal breath sounds.  Abdominal:     General: Abdomen is flat. Bowel sounds are normal. There is no distension.     Palpations: Abdomen is soft.     Tenderness: There is abdominal tenderness.     Comments: Mild suprapubic tenderness but abdomen is soft, no CVA tenderness on exam.  Musculoskeletal:        General: Tenderness present.     Comments: Paraspinal tenderness along L4-L5  Skin:    General: Skin is warm and dry.     Coloration: Skin is not jaundiced.     Findings: No rash.  Neurological:     Mental Status: She is alert. Mental status is at baseline.     Coordination: Coordination normal.     Comments: Nonfocal neuroexam.     ED Results / Procedures / Treatments   Labs (all labs ordered are listed, but only abnormal results are  displayed) Labs Reviewed  URINALYSIS, ROUTINE W REFLEX MICROSCOPIC - Abnormal; Notable for the following components:      Result Value   APPearance CLOUDY (*)    Hgb urine dipstick SMALL (*)    Leukocytes,Ua MODERATE (*)    All other components within normal limits  URINALYSIS, MICROSCOPIC (REFLEX) - Abnormal; Notable for the following components:   Bacteria, UA MANY (*)    All other components within normal limits  PREGNANCY, URINE    EKG None  Radiology No results found.  Procedures Procedures    Medications Ordered in ED Medications  lidocaine (LIDODERM) 5 % 1 patch (has no administration in time range)  ketorolac (TORADOL) 15 MG/ML injection 15 mg (has no administration in time range)    ED Course/ Medical Decision Making/ A&P                           Medical Decision Making Amount and/or Complexity of Data Reviewed Labs: ordered.  Risk Prescription drug management.   Patient presents due to back pain.  Differential includes but not limited to fracture, dislocation, muscle strain, nephrolithiasis, UTI, pyelonephritis.  On exam patient is no appreciable focal deficits.  There is no CVA tenderness.  There are some very mild suprapubic tenderness but no rigidity or guarding.  Afebrile, no risk factors for epidural abscess and based on exam and history I do not think fracture, cauda equina or malignancy would be likely.    I considered kidney stone but she is very trace hematuria no CVA tenderness on exam, I think it is more likely she has a UTI rather than nephrolithiasis.   I do not think labs are indicated.  Pain is very reproducible and I suspect most likely musculoskeletal in nature.  Will send antibiotic for suspected UTI, Toradol and Lidoderm patches given in visit with improvement on pain with reevaluation.  Return precaution percussed with the patient who verbalized understanding and agreement with the plan.        Final Clinical Impression(s) / ED  Diagnoses Final diagnoses:  None    Rx / DC Orders ED Discharge Orders     None         Theron Arista, Cordelia Poche 03/06/22 2029    Charlynne Pander, MD 03/06/22 2124

## 2022-03-06 NOTE — ED Triage Notes (Addendum)
Reports pain to lower back just to left of spine above buttocks since last night. Pt describes pain as stabbing and rates it 9/10. Reports taking "a muscle relaxer that my mom gave me" without significant relief. Denies urinary sx. Pt able to walk into triage room unassisted. Pain worse with movement. Hx kidney stones.
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
## 2022-06-16 ENCOUNTER — Ambulatory Visit
Admission: EM | Admit: 2022-06-16 | Discharge: 2022-06-16 | Disposition: A | Discharge status: Home / Self Care | Payer: Medicaid Other | Attending: Physician Assistant | Admitting: Physician Assistant

## 2022-06-16 DIAGNOSIS — J111 Influenza due to unidentified influenza virus with other respiratory manifestations: Secondary | ICD-10-CM

## 2022-06-16 MED ORDER — BENZONATATE 100 MG PO CAPS: 100.0000 mg | ORAL_CAPSULE | Freq: Four times a day (QID) | ORAL | 0 refills | Status: AC | PRN

## 2022-06-16 MED ORDER — ACETAMINOPHEN 325 MG PO TABS
975.0000 mg | ORAL_TABLET | Freq: Once | ORAL | Status: AC
  Administered 2022-06-16: 975 mg via ORAL

## 2022-06-16 MED ORDER — OSELTAMIVIR PHOSPHATE 75 MG PO CAPS: 75.0000 mg | ORAL_CAPSULE | Freq: Two times a day (BID) | ORAL | 0 refills | Status: AC

## 2022-06-16 NOTE — Discharge Instructions (Signed)
Return if any problems.

## 2022-06-16 NOTE — ED Provider Notes (Signed)
[facility]    CSN: [id number] Arrival date & time: 06/16/22  0946      History   Chief Complaint Chief Complaint  Patient presents with   Influenza    HPI Alexandra Spencer is a 27 y.o. female.   Patient complains of a cough fever and congestion.  Patient reports her child recently had influenza and she was exposed to influenza at work.  Patient reports her child received Tamiflu that seemed to help.  Patient is requesting a flu  The history is provided by the patient. No language interpreter was used.  Influenza Presenting symptoms: cough and fever   Severity:  Moderate Progression:  Worsening Chronicity:  New Relieved by:  Nothing Associated symptoms: chills   Risk factors: sick contacts     Past Medical History:  Diagnosis Date   Chlamydia    Kidney stone    Medical history non-contributory    SVD (spontaneous vaginal delivery) 02/18/2020    Patient Active Problem List   Diagnosis Date Noted   Normal pregnancy 02/18/2020   Term pregnancy 02/18/2020   SVD (spontaneous vaginal delivery) 02/18/2020   Lab test positive for detection of COVID-19 virus 12/04/2019    Past Surgical History:  Procedure Laterality Date   NO PAST SURGERIES      OB History     Gravida  2   Para  2   Term  2   Preterm      AB      Living  2      SAB      IAB      Ectopic      Multiple  0   Live Births  2            Home Medications    Prior to Admission medications   Medication Sig Start Date End Date Taking? Authorizing Provider  oseltamivir (TAMIFLU) 75 MG capsule Take 1 capsule (75 mg total) by mouth 2 (two) times daily for 5 days. 06/16/22 06/21/22 Yes Fransico Meadow, PA-C  albuterol (VENTOLIN HFA) 108 (90 Base) MCG/ACT inhaler Inhale 1-2 puffs into the lungs every 6 (six) hours as needed for wheezing or shortness of breath. 09/01/19   Long, Wonda Olds, MD  cephALEXin (KEFLEX) 500 MG capsule Take 1 capsule (500 mg total) by mouth 4 (four) times  daily. 03/06/22   Sherrill Raring, PA-C  fluticasone (FLONASE) 50 MCG/ACT nasal spray Place 2 sprays into both nostrils daily for 10 days. 09/01/19 09/11/19  Long, Wonda Olds, MD  guaiFENesin (MUCINEX) 600 MG 12 hr tablet Take 2 tablets (1,200 mg total) by mouth 2 (two) times daily as needed. 12/04/19   Leftwich-Kirby, Kathie Dike, CNM  ibuprofen (ADVIL) 800 MG tablet Take 1 tablet (800 mg total) by mouth 3 (three) times daily. 03/06/22   Sherrill Raring, PA-C  lidocaine (LIDODERM) 5 % Place 1 patch onto the skin daily. Remove & Discard patch within 12 hours or as directed by MD 03/06/22   Sherrill Raring, PA-C  methocarbamol (ROBAXIN) 500 MG tablet Take 1 tablet (500 mg total) by mouth at bedtime as needed for muscle spasms. 04/20/21   Caccavale, Sophia, PA-C  metoCLOPramide (REGLAN) 10 MG tablet Take 1 tablet (10 mg total) by mouth every 6 (six) hours. 12/04/19   Leftwich-Kirby, Kathie Dike, CNM  ondansetron (ZOFRAN ODT) 4 MG disintegrating tablet Take 1 tablet (4 mg total) by mouth every 8 (eight) hours as needed. 09/01/19   Long, Wonda Olds, MD  Family History Family History  Problem Relation Age of Onset   Breast cancer Mother    Breast cancer Maternal Grandmother    Cancer Maternal Uncle    Cancer Maternal Grandfather     Social History Social History   Tobacco Use   Smoking status: Never   Smokeless tobacco: Never  Vaping Use   Vaping Use: Never used  Substance Use Topics   Alcohol use: No   Drug use: No     Allergies   Patient has no known allergies.   Review of Systems Review of Systems  Constitutional:  Positive for chills and fever.  Respiratory:  Positive for cough.   All other systems reviewed and are negative.    Physical Exam Triage Vital Signs ED Triage Vitals [06/16/22 1018]  Enc Vitals Group     BP 133/84     Pulse Rate (!) 121     Resp 18     Temp (!) 102.5 F (39.2 C)     Temp Source Oral     SpO2 98 %     Weight      Height      Head Circumference      Peak Flow       Pain Score 6     Pain Loc      Pain Edu?      Excl. in [place]?    No data found.  Updated Vital Signs BP 133/84 (BP Location: Left Arm)   Pulse (!) 121   Temp (!) 102.5 F (39.2 C) (Oral)   Resp 18   SpO2 98%   Visual Acuity Right Eye Distance:   Left Eye Distance:   Bilateral Distance:    Right Eye Near:   Left Eye Near:    Bilateral Near:     Physical Exam Vitals and nursing note reviewed.  Constitutional:      Appearance: She is well-developed.  HENT:     Head: Normocephalic.     Nose: Nose normal.     Mouth/Throat:     Mouth: Mucous membranes are moist.  Cardiovascular:     Rate and Rhythm: Normal rate.  Pulmonary:     Effort: Pulmonary effort is normal.  Abdominal:     General: There is no distension.  Musculoskeletal:        General: Normal range of motion.     Cervical back: Normal range of motion.  Skin:    General: Skin is warm.  Neurological:     General: No focal deficit present.     Mental Status: She is alert and oriented to person, place, and time.  Psychiatric:        Mood and Affect: Mood normal.      UC Treatments / Results  Labs (all labs ordered are listed, but only abnormal results are displayed) Labs Reviewed - No data to display  EKG   Radiology No results found.  Procedures Procedures (including critical care time)  Medications Ordered in UC Medications  acetaminophen (TYLENOL) tablet 975 mg (975 mg Oral Given 06/16/22 1024)    Initial Impression / Assessment and Plan / UC Course  I have reviewed the triage vital signs and the nursing notes.  Pertinent labs & imaging results that were available during my care of the patient were reviewed by me and considered in my medical decision making (see chart for details).      Final Clinical Impressions(s) / UC Diagnoses   Final diagnoses:  Influenza with  respiratory manifestation     Discharge Instructions      Return if any problems.     ED Prescriptions      Medication Sig Dispense Auth. Provider   oseltamivir (TAMIFLU) 75 MG capsule Take 1 capsule (75 mg total) by mouth 2 (two) times daily for 5 days. 10 capsule Jimmye Wisnieski K, PA-C   benzonatate (TESSALON PERLES) 100 MG capsule Take 1 capsule (100 mg total) by mouth every 6 (six) hours as needed for cough. 30 capsule Fransico Meadow, [place]      PDMP not reviewed this encounter. Out of work x 5 days  An After Visit Summary was printed and given to the patient.       Fransico Meadow, [place] 06/16/22 1118

## 2022-06-16 NOTE — ED Triage Notes (Signed)
Pt presents with headache, generalized body aches, cough, fatigue, and fever X 3 days; pt states her daughter and a co worker had flu a little over a week ago.

## 2022-12-02 ENCOUNTER — Other Ambulatory Visit: Payer: Self-pay

## 2022-12-02 ENCOUNTER — Emergency Department
Admission: EM | Admit: 2022-12-02 | Discharge: 2022-12-02 | Disposition: A | Discharge status: Home / Self Care | Payer: Medicaid Other | Attending: Emergency Medicine | Admitting: Emergency Medicine

## 2022-12-02 DIAGNOSIS — N760 Acute vaginitis: Secondary | ICD-10-CM | POA: Insufficient documentation

## 2022-12-02 DIAGNOSIS — N898 Other specified noninflammatory disorders of vagina: Secondary | ICD-10-CM

## 2022-12-02 DIAGNOSIS — N939 Abnormal uterine and vaginal bleeding, unspecified: Secondary | ICD-10-CM | POA: Diagnosis not present

## 2022-12-02 LAB — WET PREP, GENITAL
Sperm: NONE SEEN
Trich, Wet Prep: NONE SEEN
WBC, Wet Prep HPF POC: <10 (ref <10)
Yeast Wet Prep HPF POC: NONE SEEN

## 2022-12-02 LAB — CBC
HCT: 40.7 % (ref 36.0–46.0)
Hemoglobin: 13.4 g/dL (ref 12.0–15.0)
MCH: 29.6 pg (ref 26.0–34.0)
MCHC: 32.9 g/dL (ref 30.0–36.0)
MCV: 89.8 fL (ref 80.0–100.0)
Platelets: 307 10*3/uL (ref 150–400)
RBC: 4.53 MIL/uL (ref 3.87–5.11)
RDW: 12.4 % (ref 11.5–15.5)
WBC: 8.1 10*3/uL (ref 4.0–10.5)
nRBC: 0 % (ref 0.0–0.2)

## 2022-12-02 LAB — HCG, QUANTITATIVE, PREGNANCY: hCG, Beta Chain, Quant, S: <1 m[IU]/mL (ref <5)

## 2022-12-02 LAB — CHLAMYDIA/NGC RT PCR (ARMC ONLY)
Chlamydia Tr: NOT DETECTED
N gonorrhoeae: NOT DETECTED

## 2022-12-02 MED ORDER — CEFTRIAXONE SODIUM 1 G IJ SOLR
500.0000 mg | Freq: Once | INTRAMUSCULAR | Status: AC
  Administered 2022-12-02: 500 mg via INTRAMUSCULAR
  Filled 2022-12-02: qty 10

## 2022-12-02 MED ORDER — LIDOCAINE HCL (PF) 1 % IJ SOLN
1.0000 mL | Freq: Once | INTRAMUSCULAR | Status: AC
  Administered 2022-12-02: 2 mL
  Filled 2022-12-02: qty 5

## 2022-12-02 MED ORDER — METRONIDAZOLE 0.75 % VA GEL: 1.0000 | Freq: Every day | VAGINAL | 0 refills | Status: AC

## 2022-12-02 MED ORDER — AZITHROMYCIN 500 MG PO TABS
1000.0000 mg | ORAL_TABLET | Freq: Once | ORAL | Status: AC
  Administered 2022-12-02: 1000 mg via ORAL
  Filled 2022-12-02: qty 2

## 2022-12-02 NOTE — ED Provider Notes (Addendum)
[facility] Provider Note    Event Date/Time   First MD Initiated Contact with Patient 12/02/22 0913     (approximate)   History   Vaginal Bleeding   HPI  Alexandra Spencer is a 27 y.o. female   Past medical history of history of STDs who presents to the emergency department for STD check check.  She has had some lower abdominal discomfort and vaginal discharge for the past 1 month.  Also some spotting bleeding.  She has an IUD for the last 3 years and usually does not have any vaginal bleeding or menstrual cycles while on the IUD.  She denies dysuria, denies frequency, denies fever or chills.   External Medical Documents Reviewed: STD testing in 2020 was positive for chlamydia      Physical Exam   Triage Vital Signs: ED Triage Vitals  Encounter Vitals Group     BP 12/02/22 0850 121/62     Systolic BP Percentile --      Diastolic BP Percentile --      Pulse Rate 12/02/22 0850 83     Resp 12/02/22 0850 18     Temp 12/02/22 0850 98.1 F (36.7 C)     Temp src --      SpO2 12/02/22 0850 100 %     Weight 12/02/22 0922 145 lb 1 oz (65.8 kg)     Height 12/02/22 0922 5\' 3"  (1.6 m)     Head Circumference --      Peak Flow --      Pain Score --      Pain Loc --      Pain Education --      Exclude from Growth Chart --     Most recent vital signs: Vitals:   12/02/22 0850  BP: 121/62  Pulse: 83  Resp: 18  Temp: 98.1 F (36.7 C)  SpO2: 100%    General: Awake, no distress.  CV:  Good peripheral perfusion.  Resp:  Normal effort.  Abd:  No distention.  Other:  Awake alert well-appearing comfortable pleasant in no acute distress with normal vital signs and afebrile.  Soft nontender abdomen to deep palpation all quadrants and she defers pelvic exam   ED Results / Procedures / Treatments   Labs (all labs ordered are listed, but only abnormal results are displayed) Labs Reviewed  WET PREP, GENITAL - Abnormal; Notable for the following  components:      Result Value   Clue Cells Wet Prep HPF POC PRESENT (*)    All other components within normal limits  CHLAMYDIA/NGC RT PCR ([facility] ONLY)            CBC  HCG, QUANTITATIVE, PREGNANCY     I ordered and reviewed the above labs they are notable for negative pregnancy test  PROCEDURES:  Critical Care performed: No  Procedures   MEDICATIONS ORDERED IN ED: Medications  azithromycin (ZITHROMAX) tablet 1,000 mg (1,000 mg Oral Given 12/02/22 1000)  cefTRIAXone (ROCEPHIN) injection 500 mg (500 mg Intramuscular Given 12/02/22 0959)  lidocaine (PF) (XYLOCAINE) 1 % injection 1-2.1 mL (2 mLs Other Given 12/02/22 0959)   IMPRESSION / MDM / ASSESSMENT AND PLAN / ED COURSE  I reviewed the triage vital signs and the nursing notes.                                Patient's presentation is most consistent  with acute presentation with potential threat to life or bodily function.  Differential diagnosis includes, but is not limited to, STI, PID, appendicitis, urinary tract infection, pregnancy related complications like ectopic emergency, displaced IUD   MDM:    General Discharge and vaginal spotting requesting STD testing and preferring self swab.  I offered pelvic exam but patient declines stating she would like to self swab and be treated empirically for STDs given her history of the same.  I offered to look for displaced IUD and remove IUD since this may be causing some of the pain and some spotting, but she declined instead wanted to follow-up with gynecology, I will put her in touch with on-call gynecologist [facility].    She is not pregnant.  I considered acute blood loss anemia but H&H is normal and vital signs normal, scant bleeding reported by patient doubtful.  Will be treated empirically with ceftriaxone and azithromycin.  Clue cells, discharge, treat for BV w Rx. Fu with gyn      FINAL CLINICAL IMPRESSION(S) / ED DIAGNOSES   Final diagnoses:  Vaginal  bleeding  Vaginal discharge  Bacterial vaginosis     Rx / DC Orders   ED Discharge Orders          Ordered    metroNIDAZOLE (METROGEL) 0.75 % vaginal gel  Daily at bedtime        12/02/22 1021             Note:  This document was prepared using Dragon voice recognition software and may include unintentional dictation errors.    Pilar Jarvis, MD 12/02/22 0981    Pilar Jarvis, MD 12/02/22 1044

## 2022-12-02 NOTE — ED Notes (Signed)
Pt states that she has been bleeding for the past month, reports that she has an iud and has had it for 3 years without issues, pt states that she usually doesn't get a period, pt states for the past month she has been bleeding and worse after having sex

## 2022-12-02 NOTE — Discharge Instructions (Addendum)
You can check the results of MyChart for your completed STI testing.   Use the metrogel for 5 days as prescribed.  Please call her gynecologist who is number I have put on your discharge paperwork to schedule an appointment to further discuss your vaginal bleeding and to check if your IUD may need to be replaced.  Thank you for choosing [place] for your health care today!  Please see your primary doctor this week for a follow up appointment.   If you have any new, worsening, or unexpected symptoms call your doctor right away or come back to the emergency department for reevaluation.  It was my pleasure to care for you today.   Daneil Dan [place] Charon, MD

## 2022-12-02 NOTE — ED Triage Notes (Signed)
Pt comes with c/o vaginal bleeding and cramping for over month now. Pt states she has a IUD.

## 2022-12-05 ENCOUNTER — Encounter: Payer: Medicaid Other | Admitting: Obstetrics

## 2022-12-15 ENCOUNTER — Encounter: Payer: Medicaid Other | Admitting: Obstetrics & Gynecology

## 2023-05-01 ENCOUNTER — Ambulatory Visit
Admission: EM | Admit: 2023-05-01 | Discharge: 2023-05-01 | Disposition: A | Discharge status: Home / Self Care | Payer: Self-pay | Attending: Emergency Medicine | Admitting: Emergency Medicine

## 2023-05-01 DIAGNOSIS — R051 Acute cough: Secondary | ICD-10-CM

## 2023-05-01 DIAGNOSIS — R52 Pain, unspecified: Secondary | ICD-10-CM

## 2023-05-01 DIAGNOSIS — R509 Fever, unspecified: Secondary | ICD-10-CM

## 2023-05-01 MED ORDER — PREDNISONE 10 MG (21) PO TBPK: ORAL_TABLET | Freq: Every day | ORAL | 0 refills | Status: AC

## 2023-05-01 MED ORDER — OSELTAMIVIR PHOSPHATE 75 MG PO CAPS: 75.0000 mg | ORAL_CAPSULE | Freq: Two times a day (BID) | ORAL | 0 refills | Status: AC

## 2023-05-01 NOTE — ED Triage Notes (Signed)
 Pt present with cold symptoms that started this past Monday. Pt believe she has had fever. Took tylenol  to regulate. No appetite.

## 2023-05-01 NOTE — Discharge Instructions (Signed)
 Take over-the-counter cough and cold medicines Take Tylenol  Motrin  as needed Expressed to patient she needs to stay hydrated and drink plenty of fluids

## 2023-05-01 NOTE — ED Provider Notes (Signed)
 [facility]    CSN: [id number] Arrival date & time: 05/01/23  0849      History   Chief Complaint Chief Complaint  Patient presents with   Cough   Generalized Body Aches   Nausea    HPI Alexandra Spencer is a 28 y.o. female.   Patient presents with cough congestion fever body aches chills.  He states that several coworkers have been positive for the flu she feels her symptoms are very similar.  Took Tylenol  this morning to help with fever.  Denies any nausea or vomiting just states that she does not feel like eating much or taking in fluids    Past Medical History:  Diagnosis Date   Chlamydia    Kidney stone    Medical history non-contributory    SVD (spontaneous vaginal delivery) 02/18/2020    Patient Active Problem List   Diagnosis Date Noted   Normal pregnancy 02/18/2020   Term pregnancy 02/18/2020   SVD (spontaneous vaginal delivery) 02/18/2020   Lab test positive for detection of COVID-19 virus 12/04/2019    Past Surgical History:  Procedure Laterality Date   NO PAST SURGERIES      OB History     Gravida  2   Para  2   Term  2   Preterm      AB      Living  2      SAB      IAB      Ectopic      Multiple  0   Live Births  2            Home Medications    Prior to Admission medications   Medication Sig Start Date End Date Taking? Authorizing Provider  oseltamivir  (TAMIFLU ) 75 MG capsule Take 1 capsule (75 mg total) by mouth every 12 (twelve) hours. 05/01/23  Yes Merilee Andrea CROME, NP  predniSONE  (STERAPRED UNI-PAK 21 TAB) 10 MG (21) TBPK tablet Take by mouth daily. Take 6 tabs by mouth daily  for 2 days, then 5 tabs for 2 days, then 4 tabs for 2 days, then 3 tabs for 2 days, 2 tabs for 2 days, then 1 tab by mouth daily for 2 days 05/01/23  Yes Merilee Andrea CROME, NP  albuterol  (VENTOLIN  HFA) 108 (90 Base) MCG/ACT inhaler Inhale 1-2 puffs into the lungs every 6 (six) hours as needed for wheezing or shortness of breath.  09/01/19   Long, Fonda MATSU, MD  benzonatate  (TESSALON  PERLES) 100 MG capsule Take 1 capsule (100 mg total) by mouth every 6 (six) hours as needed for cough. 06/16/22 06/16/23  Sofia, Leslie K, PA-C  cephALEXin  (KEFLEX ) 500 MG capsule Take 1 capsule (500 mg total) by mouth 4 (four) times daily. 03/06/22   Emelia Sluder, PA-C  fluticasone  (FLONASE ) 50 MCG/ACT nasal spray Place 2 sprays into both nostrils daily for 10 days. 09/01/19 09/11/19  Long, Joshua G, MD  guaiFENesin  (MUCINEX ) 600 MG 12 hr tablet Take 2 tablets (1,200 mg total) by mouth 2 (two) times daily as needed. 12/04/19   Leftwich-Kirby, Olam LABOR, CNM  ibuprofen  (ADVIL ) 800 MG tablet Take 1 tablet (800 mg total) by mouth 3 (three) times daily. 03/06/22   Emelia Sluder, PA-C  lidocaine  (LIDODERM ) 5 % Place 1 patch onto the skin daily. Remove & Discard patch within 12 hours or as directed by MD 03/06/22   Emelia Sluder, PA-C  methocarbamol  (ROBAXIN ) 500 MG tablet Take 1 tablet (500 mg total)  by mouth at bedtime as needed for muscle spasms. 04/20/21   Caccavale, Sophia, PA-C  metoCLOPramide  (REGLAN ) 10 MG tablet Take 1 tablet (10 mg total) by mouth every 6 (six) hours. 12/04/19   Leftwich-Kirby, Olam LABOR, CNM  ondansetron  (ZOFRAN  ODT) 4 MG disintegrating tablet Take 1 tablet (4 mg total) by mouth every 8 (eight) hours as needed. 09/01/19   Long, Fonda MATSU, MD    Family History Family History  Problem Relation Age of Onset   Breast cancer Mother    Breast cancer Maternal Grandmother    Cancer Maternal Uncle    Cancer Maternal Grandfather     Social History Social History   Tobacco Use   Smoking status: Never   Smokeless tobacco: Never  Vaping Use   Vaping status: Never Used  Substance Use Topics   Alcohol use: No   Drug use: No     Allergies   Patient has no known allergies.   Review of Systems Review of Systems  Constitutional:  Positive for appetite change, chills, fatigue and fever.  HENT:  Positive for congestion.   Eyes: Negative.    Respiratory:  Positive for cough.   Cardiovascular: Negative.   Gastrointestinal:  Negative for nausea and vomiting.  Genitourinary: Negative.   Musculoskeletal:        Generalized body aches  Skin: Negative.   Neurological: Negative.      Physical Exam Triage Vital Signs ED Triage Vitals  Encounter Vitals Group     BP 05/01/23 0955 118/72     Systolic BP Percentile --      Diastolic BP Percentile --      Pulse Rate 05/01/23 0955 82     Resp 05/01/23 0955 18     Temp 05/01/23 0955 98.1 F (36.7 C)     Temp Source 05/01/23 0955 Oral     SpO2 05/01/23 0955 97 %     Weight --      Height --      Head Circumference --      Peak Flow --      Pain Score 05/01/23 0954 0     Pain Loc --      Pain Education --      Exclude from Growth Chart --    No data found.  Updated Vital Signs BP 118/72 (BP Location: Left Arm)   Pulse 82   Temp 98.1 F (36.7 C) (Oral)   Resp 18   LMP  (LMP Unknown)   SpO2 97%   Visual Acuity Right Eye Distance:   Left Eye Distance:   Bilateral Distance:    Right Eye Near:   Left Eye Near:    Bilateral Near:     Physical Exam Constitutional:      Appearance: Normal appearance.  HENT:     Right Ear: Tympanic membrane normal.     Left Ear: Tympanic membrane normal.     Nose: Congestion present.     Mouth/Throat:     Mouth: Mucous membranes are moist.  Eyes:     Pupils: Pupils are equal, round, and reactive to light.  Cardiovascular:     Rate and Rhythm: Normal rate.  Pulmonary:     Effort: Pulmonary effort is normal.     Comments: Dry cough Abdominal:     General: Abdomen is flat.  Neurological:     General: No focal deficit present.     Mental Status: She is alert.      UC Treatments /  Results  Labs (all labs ordered are listed, but only abnormal results are displayed) Labs Reviewed - No data to display  EKG   Radiology No results found.  Procedures Procedures (including critical care time)  Medications Ordered  in UC Medications - No data to display  Initial Impression / Assessment and Plan / UC Course  I have reviewed the triage vital signs and the nursing notes.  Pertinent labs & imaging results that were available during my care of the patient were reviewed by me and considered in my medical decision making (see chart for details).     Treating for symptoms of influenza Take over-the-counter cough and cold medicines Take Tylenol  Motrin  as needed Expressed to patient she needs to stay hydrated and drink plenty of fluids  Final Clinical Impressions(s) / UC Diagnoses   Final diagnoses:  Acute cough  Body aches  Fever, unspecified     Discharge Instructions      Take over-the-counter cough and cold medicines Take Tylenol  Motrin  as needed Expressed to patient she needs to stay hydrated and drink plenty of fluids     ED Prescriptions     Medication Sig Dispense Auth. Provider   oseltamivir  (TAMIFLU ) 75 MG capsule Take 1 capsule (75 mg total) by mouth every 12 (twelve) hours. 10 capsule Merilee Andrea CROME, NP   predniSONE  (STERAPRED UNI-PAK 21 TAB) 10 MG (21) TBPK tablet Take by mouth daily. Take 6 tabs by mouth daily  for 2 days, then 5 tabs for 2 days, then 4 tabs for 2 days, then 3 tabs for 2 days, 2 tabs for 2 days, then 1 tab by mouth daily for 2 days 42 tablet Merilee Andrea CROME, NP      PDMP not reviewed this encounter.   Merilee Andrea CROME, NP 05/01/23 1045

## 2023-10-30 ENCOUNTER — Emergency Department
Admission: EM | Admit: 2023-10-30 | Discharge: 2023-10-30 | Disposition: A | Discharge status: Home / Self Care | Payer: Self-pay | Attending: Emergency Medicine | Admitting: Emergency Medicine

## 2023-10-30 ENCOUNTER — Other Ambulatory Visit: Payer: Self-pay

## 2023-10-30 DIAGNOSIS — K0889 Other specified disorders of teeth and supporting structures: Secondary | ICD-10-CM | POA: Diagnosis present

## 2023-10-30 MED ORDER — AMOXICILLIN-POT CLAVULANATE 875-125 MG PO TABS: 1.0000 | ORAL_TABLET | Freq: Two times a day (BID) | ORAL | 0 refills | Status: AC

## 2023-10-30 MED ORDER — NAPROXEN 500 MG PO TABS
500.0000 mg | ORAL_TABLET | Freq: Once | ORAL | Status: AC
  Administered 2023-10-30: 500 mg via ORAL
  Filled 2023-10-30: qty 1

## 2023-10-30 MED ORDER — HYDROCODONE-ACETAMINOPHEN 5-325 MG PO TABS: 1.0000 | ORAL_TABLET | ORAL | 0 refills | Status: AC | PRN

## 2023-10-30 MED ORDER — LIDOCAINE VISCOUS HCL 2 % MT SOLN
15.0000 mL | Freq: Once | OROMUCOSAL | Status: AC
  Administered 2023-10-30: 15 mL via OROMUCOSAL
  Filled 2023-10-30: qty 15

## 2023-10-30 NOTE — Discharge Instructions (Signed)
 Your evaluated in the ED for dental pain.  Gargle with warm salt water twice daily.  You can apply Orajel to the affected tooth.  Follow-up with your dentist for further evaluation.  A list has been provided for you in your discharge papers.

## 2023-10-30 NOTE — ED Triage Notes (Signed)
 Pt comes with dental pain for awhile. Pt states it has been on and off . Pt states her medicaid got cut off about 6 months ago and it has been hurting ever since. Pt states right sided pain.

## 2023-10-30 NOTE — ED Provider Notes (Signed)
 [facility] Emergency Department Provider Note     Event Date/Time   First MD Initiated Contact with Patient 10/30/23 1210     (approximate)   History   Dental Pain   HPI  Alexandra Spencer is a 28 y.o. female with no significant past medical history presents to the ED for evaluation of dental pain localized to tooth #4 to her upper jaw ongoing for 8 months however unbearable x 2 days.  Patient reports she has been taking daily NSAIDs without any relief.  Unknown fever.  Reports needing a root canal performed to the specific tooth, however due to her Medicaid being cut off she has not been able to get this procedure at this time.     Physical Exam   Triage Vital Signs: ED Triage Vitals  Encounter Vitals Group     BP 10/30/23 1200 125/84     Girls Systolic BP Percentile --      Girls Diastolic BP Percentile --      Boys Systolic BP Percentile --      Boys Diastolic BP Percentile --      Pulse Rate 10/30/23 1200 77     Resp 10/30/23 1200 18     Temp 10/30/23 1200 98.7 F (37.1 C)     Temp src --      SpO2 10/30/23 1200 100 %     Weight 10/30/23 1159 165 lb (74.8 kg)     Height 10/30/23 1159 5' 3 (1.6 m)     Head Circumference --      Peak Flow --      Pain Score 10/30/23 1159 10     Pain Loc --      Pain Education --      Exclude from Growth Chart --     Most recent vital signs: Vitals:   10/30/23 1200 10/30/23 1226  BP: 125/84   Pulse: 77   Resp: 18   Temp: 98.7 F (37.1 C)   SpO2: 100% 100%    General Well appearing.  Awake, no distress.  HEENT NCAT.  CV:  Good peripheral perfusion.  RESP:  Normal effort.  ABD:  No distention.  Other:  Nontender to palpation over the gumline.  No gingivitis.  No abscess palpated or visualized.  Generalized stated tenderness to tooth # 4 with palpation.  Oropharynx clear.  Uvula is midline.   ED Results / Procedures / Treatments   Labs (all labs ordered are listed, but only abnormal results  are displayed) Labs Reviewed - No data to display  No results found.  PROCEDURES:  Critical Care performed: No  Procedures   MEDICATIONS ORDERED IN ED: Medications  lidocaine  (XYLOCAINE ) 2 % viscous mouth solution 15 mL (has no administration in time range)  naproxen  (NAPROSYN ) tablet 500 mg (has no administration in time range)     IMPRESSION / MDM / ASSESSMENT AND PLAN / ED COURSE  I reviewed the triage vital signs and the nursing notes.                               28 y.o. female presents to the emergency department for evaluation and treatment of chronic dental pain. See HPI for further details.   Differential diagnosis includes, but is not limited to dental caries, dental infection, dental abscess  Patient's presentation is most consistent with acute, uncomplicated illness.  Patient is alert and oriented.  She is hemodynamic stable.  Physical exam findings are as stated above.  No abscess indicating I&D the at this time.  Offered dental block however patient is unsure.  Advised we will try course of pain medication and if unrelieved pain can return to ED for possible dental block.  She is agreement with this plan.  I encouraged her close follow-up with a dentist.  She is in stable condition for discharge home.  Lidocaine  viscous and naproxen  given in ED.  ED return precaution discussed.   FINAL CLINICAL IMPRESSION(S) / ED DIAGNOSES   Final diagnoses:  Pain, dental   Rx / DC Orders   ED Discharge Orders          Ordered    amoxicillin -clavulanate (AUGMENTIN ) 875-125 MG tablet  2 times daily        10/30/23 1242    HYDROcodone -acetaminophen  (NORCO/VICODIN) 5-325 MG tablet  Every 4 hours PRN        10/30/23 1242            Note:  This document was prepared using Dragon voice recognition software and may include unintentional dictation errors.    Margrette, Opie Fanton A, PA-C 10/30/23 1252    Claudene Rover, MD 10/30/23 [phone]
